# Patient Record
Sex: Male | Born: 1970 | Race: White | Hispanic: No | Marital: Single
Health system: Southern US, Community
[De-identification: ages and names within clinical notes are randomized; demographics above are authoritative.]

## PROBLEM LIST (undated history)

## (undated) DIAGNOSIS — E119 Type 2 diabetes mellitus without complications: Secondary | ICD-10-CM

## (undated) DIAGNOSIS — R197 Diarrhea, unspecified: Secondary | ICD-10-CM

## (undated) DIAGNOSIS — D72829 Elevated white blood cell count, unspecified: Secondary | ICD-10-CM

## (undated) DIAGNOSIS — E785 Hyperlipidemia, unspecified: Secondary | ICD-10-CM

## (undated) DIAGNOSIS — I10 Essential (primary) hypertension: Secondary | ICD-10-CM

## (undated) DIAGNOSIS — R7989 Other specified abnormal findings of blood chemistry: Secondary | ICD-10-CM

## (undated) DIAGNOSIS — F329 Major depressive disorder, single episode, unspecified: Secondary | ICD-10-CM

## (undated) DIAGNOSIS — E876 Hypokalemia: Secondary | ICD-10-CM

## (undated) DIAGNOSIS — F419 Anxiety disorder, unspecified: Secondary | ICD-10-CM

---

## 1994-12-14 HISTORY — PX: FEMUR FRACTURE SURGERY: SHX633

## 1995-12-15 HISTORY — PX: KNEE SURGERY: SHX244

## 2002-12-14 HISTORY — PX: KIDNEY STONE SURGERY: SHX686

## 2012-05-05 ENCOUNTER — Inpatient Hospital Stay (HOSPITAL_COMMUNITY)
Admission: EM | Admit: 2012-05-05 | Discharge: 2012-05-06 | DRG: 312 | Disposition: A | Discharge status: Home / Self Care | Payer: 59 | Attending: Internal Medicine | Admitting: Internal Medicine

## 2012-05-05 ENCOUNTER — Emergency Department (HOSPITAL_COMMUNITY): Payer: 59

## 2012-05-05 ENCOUNTER — Encounter (HOSPITAL_COMMUNITY): Payer: Self-pay | Admitting: *Deleted

## 2012-05-05 ENCOUNTER — Inpatient Hospital Stay (HOSPITAL_COMMUNITY): Payer: 59

## 2012-05-05 DIAGNOSIS — R55 Syncope and collapse: Principal | ICD-10-CM | POA: Diagnosis present

## 2012-05-05 DIAGNOSIS — R7989 Other specified abnormal findings of blood chemistry: Secondary | ICD-10-CM

## 2012-05-05 DIAGNOSIS — F3289 Other specified depressive episodes: Secondary | ICD-10-CM | POA: Diagnosis present

## 2012-05-05 DIAGNOSIS — R739 Hyperglycemia, unspecified: Secondary | ICD-10-CM

## 2012-05-05 DIAGNOSIS — R4182 Altered mental status, unspecified: Secondary | ICD-10-CM

## 2012-05-05 DIAGNOSIS — F329 Major depressive disorder, single episode, unspecified: Secondary | ICD-10-CM | POA: Diagnosis present

## 2012-05-05 DIAGNOSIS — R197 Diarrhea, unspecified: Secondary | ICD-10-CM | POA: Diagnosis present

## 2012-05-05 DIAGNOSIS — I10 Essential (primary) hypertension: Secondary | ICD-10-CM | POA: Diagnosis present

## 2012-05-05 DIAGNOSIS — F32A Depression, unspecified: Secondary | ICD-10-CM | POA: Diagnosis present

## 2012-05-05 DIAGNOSIS — E876 Hypokalemia: Secondary | ICD-10-CM | POA: Diagnosis present

## 2012-05-05 DIAGNOSIS — F411 Generalized anxiety disorder: Secondary | ICD-10-CM | POA: Diagnosis present

## 2012-05-05 DIAGNOSIS — E119 Type 2 diabetes mellitus without complications: Secondary | ICD-10-CM | POA: Diagnosis present

## 2012-05-05 DIAGNOSIS — E871 Hypo-osmolality and hyponatremia: Secondary | ICD-10-CM

## 2012-05-05 DIAGNOSIS — D72829 Elevated white blood cell count, unspecified: Secondary | ICD-10-CM | POA: Diagnosis present

## 2012-05-05 DIAGNOSIS — R509 Fever, unspecified: Secondary | ICD-10-CM

## 2012-05-05 DIAGNOSIS — E785 Hyperlipidemia, unspecified: Secondary | ICD-10-CM | POA: Diagnosis present

## 2012-05-05 DIAGNOSIS — E1165 Type 2 diabetes mellitus with hyperglycemia: Secondary | ICD-10-CM

## 2012-05-05 DIAGNOSIS — F419 Anxiety disorder, unspecified: Secondary | ICD-10-CM | POA: Diagnosis present

## 2012-05-05 HISTORY — DX: Essential (primary) hypertension: I10

## 2012-05-05 HISTORY — DX: Anxiety disorder, unspecified: F41.9

## 2012-05-05 HISTORY — DX: Elevated white blood cell count, unspecified: D72.829

## 2012-05-05 HISTORY — DX: Major depressive disorder, single episode, unspecified: F32.9

## 2012-05-05 HISTORY — DX: Hyperlipidemia, unspecified: E78.5

## 2012-05-05 HISTORY — DX: Hypokalemia: E87.6

## 2012-05-05 HISTORY — DX: Diarrhea, unspecified: R19.7

## 2012-05-05 HISTORY — DX: Depression, unspecified: F32.A

## 2012-05-05 HISTORY — DX: Other specified abnormal findings of blood chemistry: R79.89

## 2012-05-05 LAB — COMPREHENSIVE METABOLIC PANEL
AST: 34 U/L (ref 0–37)
Albumin: 3.9 g/dL (ref 3.5–5.2)
BUN: 5 mg/dL — ABNORMAL LOW (ref 6–23)
Creatinine, Ser: 0.93 mg/dL (ref 0.50–1.35)
Total Protein: 7.3 g/dL (ref 6.0–8.3)

## 2012-05-05 LAB — GLUCOSE, CAPILLARY
Glucose-Capillary: 113 mg/dL — ABNORMAL HIGH (ref 70–99)
Glucose-Capillary: 115 mg/dL — ABNORMAL HIGH (ref 70–99)

## 2012-05-05 LAB — RAPID URINE DRUG SCREEN, HOSP PERFORMED
Amphetamines: NOT DETECTED
Benzodiazepines: POSITIVE — AB
Cocaine: NOT DETECTED
Opiates: NOT DETECTED
Tetrahydrocannabinol: NOT DETECTED

## 2012-05-05 LAB — DIFFERENTIAL
Basophils Absolute: 0 10*3/uL (ref 0.0–0.1)
Basophils Relative: 0 % (ref 0–1)
Eosinophils Absolute: 0.1 10*3/uL (ref 0.0–0.7)
Monocytes Absolute: 1.7 10*3/uL — ABNORMAL HIGH (ref 0.1–1.0)
Monocytes Relative: 8 % (ref 3–12)
Neutrophils Relative %: 79 % — ABNORMAL HIGH (ref 43–77)

## 2012-05-05 LAB — POCT I-STAT TROPONIN I

## 2012-05-05 LAB — URINALYSIS, MICROSCOPIC ONLY
Bilirubin Urine: NEGATIVE
Glucose, UA: >1000 mg/dL — AB
Hgb urine dipstick: NEGATIVE
Ketones, ur: NEGATIVE mg/dL
Protein, ur: NEGATIVE mg/dL
Urine-Other: NONE SEEN

## 2012-05-05 LAB — CK TOTAL AND CKMB (NOT AT ARMC)
CK, MB: 4.8 ng/mL — ABNORMAL HIGH (ref 0.3–4.0)
Total CK: 212 U/L (ref 7–232)

## 2012-05-05 LAB — CARDIAC PANEL(CRET KIN+CKTOT+MB+TROPI)
CK, MB: 4 ng/mL (ref 0.3–4.0)
Total CK: 174 U/L (ref 7–232)

## 2012-05-05 LAB — CBC
HCT: 47.9 % (ref 39.0–52.0)
Hemoglobin: 17 g/dL (ref 13.0–17.0)
MCH: 31.8 pg (ref 26.0–34.0)
MCHC: 35.5 g/dL (ref 30.0–36.0)
RDW: 12.6 % (ref 11.5–15.5)

## 2012-05-05 MED ORDER — INSULIN ASPART 100 UNIT/ML ~~LOC~~ SOLN
0.0000 [IU] | Freq: Three times a day (TID) | SUBCUTANEOUS | Status: DC
  Administered 2012-05-06: 3 [IU] via SUBCUTANEOUS

## 2012-05-05 MED ORDER — ONDANSETRON HCL 4 MG/2ML IJ SOLN: 4.0000 mg | Freq: Four times a day (QID) | INTRAMUSCULAR | Status: DC | PRN

## 2012-05-05 MED ORDER — ACETAMINOPHEN 650 MG RE SUPP
650.0000 mg | Freq: Once | RECTAL | Status: AC
  Administered 2012-05-05: 650 mg via RECTAL
  Filled 2012-05-05: qty 1

## 2012-05-05 MED ORDER — SODIUM CHLORIDE 0.9 % IV SOLN
Freq: Once | INTRAVENOUS | Status: AC
  Administered 2012-05-05: 14:00:00 via INTRAVENOUS

## 2012-05-05 MED ORDER — ONDANSETRON HCL 4 MG PO TABS: 4.0000 mg | ORAL_TABLET | Freq: Four times a day (QID) | ORAL | Status: DC | PRN

## 2012-05-05 MED ORDER — ACETAMINOPHEN 650 MG RE SUPP: 650.0000 mg | Freq: Four times a day (QID) | RECTAL | Status: DC | PRN

## 2012-05-05 MED ORDER — NICOTINE 14 MG/24HR TD PT24
14.0000 mg | MEDICATED_PATCH | Freq: Every day | TRANSDERMAL | Status: DC
  Administered 2012-05-06: 14 mg via TRANSDERMAL
  Filled 2012-05-05 (×2): qty 1

## 2012-05-05 MED ORDER — ALUM & MAG HYDROXIDE-SIMETH 200-200-20 MG/5ML PO SUSP: 30.0000 mL | Freq: Four times a day (QID) | ORAL | Status: DC | PRN

## 2012-05-05 MED ORDER — POTASSIUM CHLORIDE CRYS ER 20 MEQ PO TBCR
40.0000 meq | EXTENDED_RELEASE_TABLET | ORAL | Status: AC
  Administered 2012-05-05 – 2012-05-06 (×2): 40 meq via ORAL
  Filled 2012-05-05 (×2): qty 2

## 2012-05-05 MED ORDER — CITALOPRAM HYDROBROMIDE 40 MG PO TABS
40.0000 mg | ORAL_TABLET | Freq: Every day | ORAL | Status: DC
  Administered 2012-05-05 – 2012-05-06 (×2): 40 mg via ORAL
  Filled 2012-05-05 (×2): qty 1

## 2012-05-05 MED ORDER — HYDROCHLOROTHIAZIDE 25 MG PO TABS
25.0000 mg | ORAL_TABLET | Freq: Every day | ORAL | Status: DC
  Administered 2012-05-05 – 2012-05-06 (×2): 25 mg via ORAL
  Filled 2012-05-05 (×2): qty 1

## 2012-05-05 MED ORDER — POTASSIUM CHLORIDE 10 MEQ/100ML IV SOLN
10.0000 meq | INTRAVENOUS | Status: AC
  Administered 2012-05-05 (×2): 10 meq via INTRAVENOUS
  Filled 2012-05-05 (×2): qty 100

## 2012-05-05 MED ORDER — INSULIN DETEMIR 100 UNIT/ML ~~LOC~~ SOLN
10.0000 [IU] | Freq: Every day | SUBCUTANEOUS | Status: DC
  Administered 2012-05-05: 10 [IU] via SUBCUTANEOUS
  Filled 2012-05-05: qty 10

## 2012-05-05 MED ORDER — TESTOSTERONE CYPIONATE 100 MG/ML IM SOLN: 200.0000 mg | INTRAMUSCULAR | Status: DC

## 2012-05-05 MED ORDER — HALOPERIDOL LACTATE 5 MG/ML IJ SOLN: 5.0000 mg | Freq: Once | INTRAMUSCULAR | Status: DC

## 2012-05-05 MED ORDER — LORAZEPAM 2 MG/ML IJ SOLN
INTRAMUSCULAR | Status: AC
  Administered 2012-05-05: 2 mg via INTRAVENOUS
  Filled 2012-05-05: qty 1

## 2012-05-05 MED ORDER — ACETAMINOPHEN 325 MG PO TABS: 650.0000 mg | ORAL_TABLET | Freq: Four times a day (QID) | ORAL | Status: DC | PRN

## 2012-05-05 MED ORDER — ENOXAPARIN SODIUM 40 MG/0.4ML ~~LOC~~ SOLN
40.0000 mg | SUBCUTANEOUS | Status: DC
  Administered 2012-05-05: 40 mg via SUBCUTANEOUS
  Filled 2012-05-05 (×2): qty 0.4

## 2012-05-05 MED ORDER — POTASSIUM CHLORIDE IN NACL 40-0.9 MEQ/L-% IV SOLN
INTRAVENOUS | Status: DC
  Administered 2012-05-05 – 2012-05-06 (×3): via INTRAVENOUS
  Filled 2012-05-05 (×4): qty 1000

## 2012-05-05 MED ORDER — POTASSIUM CHLORIDE CRYS ER 20 MEQ PO TBCR
40.0000 meq | EXTENDED_RELEASE_TABLET | Freq: Once | ORAL | Status: AC
  Administered 2012-05-05: 40 meq via ORAL
  Filled 2012-05-05: qty 2

## 2012-05-05 MED ORDER — SODIUM CHLORIDE 0.9 % IJ SOLN
3.0000 mL | Freq: Two times a day (BID) | INTRAMUSCULAR | Status: DC
  Administered 2012-05-06: 3 mL via INTRAVENOUS

## 2012-05-05 MED ORDER — OXYCODONE HCL 5 MG PO TABS: 5.0000 mg | ORAL_TABLET | ORAL | Status: DC | PRN

## 2012-05-05 MED ORDER — SODIUM CHLORIDE 0.9 % IV BOLUS (SEPSIS)
1000.0000 mL | Freq: Once | INTRAVENOUS | Status: AC
  Administered 2012-05-05: 1000 mL via INTRAVENOUS

## 2012-05-05 MED ORDER — ALPRAZOLAM 1 MG PO TABS
1.0000 mg | ORAL_TABLET | Freq: Two times a day (BID) | ORAL | Status: DC | PRN
  Administered 2012-05-05 – 2012-05-06 (×2): 1 mg via ORAL
  Filled 2012-05-05: qty 2
  Filled 2012-05-05: qty 1

## 2012-05-05 MED ORDER — LORAZEPAM 2 MG/ML IJ SOLN
2.0000 mg | Freq: Once | INTRAMUSCULAR | Status: AC
  Administered 2012-05-05: 2 mg via INTRAVENOUS

## 2012-05-05 MED ORDER — LINAGLIPTIN 5 MG PO TABS
5.0000 mg | ORAL_TABLET | Freq: Every day | ORAL | Status: DC
  Administered 2012-05-05 – 2012-05-06 (×2): 5 mg via ORAL
  Filled 2012-05-05 (×2): qty 1

## 2012-05-05 MED ORDER — SIMVASTATIN 20 MG PO TABS
20.0000 mg | ORAL_TABLET | Freq: Every day | ORAL | Status: DC
  Filled 2012-05-05: qty 1

## 2012-05-05 NOTE — H&P (Signed)
REDELL NAZIR MRN: [id number] DOB/AGE: 06/09/1971 41 y.o. Primary Care Physician:BRYAN,Marit Goodwill J, PA, PA Admit date: 05/05/2012 Chief Complaint: Syncope HPI:  Wael Maestas is a 41 year old Caucasian gentleman with a history of type 2 diabetes, hypokalemia, chronic leukocytosis, hyperlipidemia, depression/anxiety who presents to the ED with a syncopal episode. History was obtained from the ED physician's notes as well as patient and his fiancee. Patient and family does state that the patient went to work today patient states that has had more output than input. Patient stated that he felt hot at work and drank Gatorade. Patient stated that he suddenly fell like a lead was placed on and subsequently passed out. Patient's fianc stated that she was called by the patient's boss stating that patient had passed out for approximately 2 minutes. Patient subsequently came around after the bloodwork [name] and his name. Once patient awoke patient was confused and in a stupor and patient had a blank stare. Patient was unable to remember his family's names including his fiance and his children. Patient was also noted to be diaphoretic. Patient was subsequently brought per EMS to the ED. In the ED patient was noted to be very agitated was represent outlines and yelling was very incoherent and confused. Patient subsequently became alert and oriented and was answering questions appropriately. Patient denied any headache. No neck stiffness. Patient denied any chest pain, no shortness of breath, no abdominal pain, no dysuria, no cough, no fever, no chills, no loss of bowel or urinary incontinence. Patient does endorse some emesis, some nausea, and generalized weakness. Patient has also endorsed diarrhea which has been on and off for several months after being started on diabetic medications. Patient denies any recent antibiotic use. Lab work was obtained in the ED CT of the head which was done was negative. Chest x-ray which  was done was negative. A CBC was done which showed a white count of 21.7 and a hemoglobin of 17.0. Lactic acid level came back elevated at 3.6. LFTs were within normal limits. EKG done showed a sinus tachycardia. Be met which was done did show a sodium of 131 a potassium of 2.5 and a glucose of 395 otherwise was within normal limits. Will call to admit the patient for further evaluation and management. Patient is currently alert and oriented x3 with coherent speech.  Past Medical History  Diagnosis Date  . Diabetes mellitus   . Hypertension   . Anxiety   . Hypokalemia 05/05/2012  . Leukocytosis 05/05/2012  . Diarrhea 05/05/2012  . HTN (hypertension) 05/05/2012  . Diabetes mellitus 05/05/2012  . Hyperlipidemia 05/05/2012  . Depression 05/05/2012  . Low testosterone 05/05/2012    Past Surgical History  Procedure Date  . Femur fracture surgery 1996    shattered on right side, inserted rod  . Knee surgery 1997    right knee  . Kidney stone surgery 2004    Prior to Admission medications   Medication Sig Start Date End Date Taking? Authorizing Provider  ALPRAZolam Prudy Feeler) 1 MG tablet Take 1 mg by mouth 2 (two) times daily as needed. anxiety   Yes Historical Provider, MD  citalopram (CELEXA) 40 MG tablet Take 40 mg by mouth daily.   Yes Historical Provider, MD  glimepiride (AMARYL) 4 MG tablet Take 2 mg by mouth daily before breakfast. Pt takes 1/2 tab of 4mg  tab for total dose of 2mg    Yes Historical Provider, MD  hydrochlorothiazide (HYDRODIURIL) 25 MG tablet Take 25 mg by mouth daily.   Yes  Historical Provider, MD  insulin aspart (NOVOLOG) 100 UNIT/ML injection Inject into the skin daily. Pt uses sliding scale   Yes Historical Provider, MD  insulin detemir (LEVEMIR) 100 UNIT/ML injection Inject 10 Units into the skin at bedtime.   Yes Historical Provider, MD  potassium chloride SA (K-DUR,KLOR-CON) 20 MEQ tablet Take 20 mEq by mouth daily.   Yes Historical Provider, MD  pravastatin (PRAVACHOL)  40 MG tablet Take 40 mg by mouth daily.   Yes Historical Provider, MD  sitaGLIPtin (JANUVIA) 100 MG tablet Take 100 mg by mouth daily.   Yes Historical Provider, MD  testosterone cypionate (DEPOTESTOTERONE CYPIONATE) 100 MG/ML injection Inject 200 mg into the muscle every 14 (fourteen) days. For IM use only   Yes Historical Provider, MD    Allergies:  Allergies  Allergen Reactions  . Metformin And Related Nausea And Vomiting  . Victoza (Liraglutide) Nausea And Vomiting    History reviewed. No pertinent family history.  Social History:  reports that he has been smoking Cigarettes.  He has a 7.5 pack-year smoking history. His smokeless tobacco use includes Snuff. He reports that he does not drink alcohol or use illicit drugs.  ROS: All systems reviewed with the patient and was positive as per HPI otherwise all other systems are negative.  PHYSICAL EXAM: Blood pressure 133/91, pulse 86, temperature 100 F (37.8 C), resp. rate 21, SpO2 95.00%. General: Well-developed well-nourished in no acute cardiopulmonary distress. Alert, awake, oriented x3, in no acute distress. HEENT: Normocephalic atraumatic. Pupils equal round and reactive light and accommodation. Extraocular movements intact. Oropharynx is clear, no lesions, no exudates. Neck is supple with no lymphadenopathy. No bruits, no goiter. Heart: Regular rate and rhythm, without murmurs, rubs, gallops. Lungs: Clear to auscultation bilaterally. Abdomen: Soft, nontender, nondistended, positive bowel sounds. Extremities: No clubbing cyanosis or edema with positive pedal pulses. Neuro: Alert and oriented x3. Cranial nerves II through XII are grossly intact. Visual fields are intact. 5 out of 5 bilateral upper extremity strength. Follow 5 bilateral lower extremity strength. Sensation is intact. Gait not tested secondary to safety. Grossly intact, nonfocal.    EKG: Sinus tachycardia  No results found for this or any previous visit (from the  past 240 hour(s)).   Lab results:  Basename 05/05/12 1145  NA 131*  K 2.5*  CL 94*  CO2 22  GLUCOSE 395*  BUN 5*  CREATININE 0.93  CALCIUM 8.9  MG 1.9  PHOS --    Basename 05/05/12 1145  AST 34  ALT 36  ALKPHOS 108  BILITOT 0.3  PROT 7.3  ALBUMIN 3.9   No results found for this basename: LIPASE:2,AMYLASE:2 in the last 72 hours  Basename 05/05/12 1145  WBC 21.7*  NEUTROABS 17.1*  HGB 17.0  HCT 47.9  MCV 89.5  PLT 348    Basename 05/05/12 1352  CKTOTAL 212  CKMB 4.8*  CKMBINDEX --  TROPONINI --   No components found with this basename: POCBNP:3 No results found for this basename: DDIMER in the last 72 hours No results found for this basename: HGBA1C:2 in the last 72 hours No results found for this basename: CHOL:2,HDL:2,LDLCALC:2,TRIG:2,CHOLHDL:2,LDLDIRECT:2 in the last 72 hours No results found for this basename: TSH,T4TOTAL,FREET3,T3FREE,THYROIDAB in the last 72 hours No results found for this basename: VITAMINB12:2,FOLATE:2,FERRITIN:2,TIBC:2,IRON:2,RETICCTPCT:2 in the last 72 hours Imaging results:  Ct Head Wo Contrast  05/05/2012  *RADIOLOGY REPORT*  Clinical Data: Altered level of consciousness, confusion, question hemorrhage  CT HEAD WITHOUT CONTRAST  Technique:  Contiguous axial images  were obtained from the base of the skull through the vertex without contrast.  Comparison: None  Findings: Normal ventricular morphology. No midline shift or mass effect. Normal appearance of brain parenchyma. No intracranial hemorrhage, mass lesion, or acute infarction. Visualized paranasal sinuses and mastoid air cells clear. Bones unremarkable.  IMPRESSION: Normal exam.  Original Report Authenticated By: Lollie Marrow, M.D.   Dg Chest Port 1 View  05/05/2012  *RADIOLOGY REPORT*  Clinical Data: Altered mental status.  PORTABLE CHEST - 1 VIEW  Comparison: None.  Findings: Low lung volumes. Heart and mediastinal contours are within normal limits.  No focal opacities or  effusions.  No acute bony abnormality.  IMPRESSION: Low volumes. No active cardiopulmonary disease.  Original Report Authenticated By: Cyndie Chime, M.D.   Impression/Plan:  Principal Problem:  *Syncope and collapse Active Problems:  Hypokalemia  Leukocytosis  Diarrhea  HTN (hypertension)  Diabetes mellitus  Hyperlipidemia  Depression  Anxiety   #1 syncope and collapse Questionable etiology differential includes orthostasis versus dehydration versus electrolyte abnormalities versus seizure activity as patient was postictal did have some confusion and a blank stare. Will check orthostatics. We'll cycle cardiac enzymes every 8 hours x3. Will check carotid Dopplers. Will check a 2-D echo. CT of the head was negative. We'll get MRI of the brain. We'll place on telemetry. Will hydrate with IV fluids and replete electrolytes. Will check an EEG. We'll check a TSH. The ED PA is awaiting a call for neurology for consultation for further evaluation and management.  #2 hypokalemia Likely multifactorial secondary to GI losses as patient has had some diarrhea nausea and emesis as well as secondary to diuretic therapy which HCTZ. Patient's magnesium level is 1.9. Replete potassium and follow. Patient may need a higher dose of his potassium supplementation on discharge.  #3 diarrhea Likely secondary to diabetes medications as patient has noticed some improvement after medications were changed around. Will check a C. difficile PCR. And follow. MCV PCR is negative we'll place on Imodium as needed.  #4 chronic leukocytosis Patient states that he's had a chronic leukocytosis and has been worked up extensively even by an oncologist with no etiology. Urinalysis negative for UTI. Chest x-ray is negative. Patient has no meningeal signs or symptoms. Blood cultures are pending. Will continue to monitor for now. No need for antibiotics at this time.  #5 diabetes mellitus Check a hemoglobin A1c. Continue  patient's home dose Levemir and Januvia. We'll place on a sliding scale insulin.  #6 hyperlipidemia Check a fasting lipid panel. Place on Zocor.  #8 depression/anxiety Continue home regimen of Celexa and Xanax.  #9 tobacco abuse Patient has been counseled on tobacco cessation. We'll place on a nicotine patch.  #10 prophylaxis Lovenox for DVT prophylaxis.   Shemicka Cohrs 05/05/2012 [id number] , 3:12 PM

## 2012-05-05 NOTE — ED Notes (Signed)
Report given to akia, rn on floor 

## 2012-05-05 NOTE — ED Notes (Signed)
Per ems pt was at work and pt was sitting in a chair and then he laid himself down on the floor. After that pt was not alert, other staff could not make contact with pt or get pt to answer for 30 seconds. When pt came to pt stated "he was the golden child" pt did not know where he was or who he was, or the date. Pt gave blank stare. Stroke scale performed was negative by ems. Slightly diaphoretic. Pt was not cooperative and pulling at stickers and tubes. Pt during transport was laughing and clapping his hands.  20 g L hand.   Upon arrival to ED pt was more diaphoretic, would not answer any questions, would not follow commands, gives blank stares.

## 2012-05-05 NOTE — ED Notes (Signed)
[id number]<AV> Expected date:05/05/12<BR> Expected time:10:51 AM<BR> Means of arrival:Ambulance<BR> Comments:<BR> syncope

## 2012-05-05 NOTE — Consult Note (Signed)
Reason for Consult:Syncope Referring Physician: Janee Morn  CC: Syncope  HPI: Mark Knapp is an 41 y.o. male  who presents to the ED after a syncopal episode. History was obtained from the ED physician's notes as well as patient. Patient stated that he suddenly fell like a lead was placed on and subsequently passed out. Patient's fianc stated that she was called by the patient's boss stating that patient had passed out for approximately 2 minutes.  Once patient awoke patient was confused and had a blank stare. Patient was unable to remember his family's names including his fiance and his children. Patient was also noted to be diaphoretic. Patient was subsequently brought per EMS to the ED. In the ED patient was noted to be very agitated/combative and yelling was very incoherent and confused. Patient subsequently became alert and oriented and was answering questions appropriately.   Past Medical History  Diagnosis Date  . Diabetes mellitus   . Hypertension   . Anxiety   . Hypokalemia 05/05/2012  . Leukocytosis 05/05/2012  . Diarrhea 05/05/2012  . HTN (hypertension) 05/05/2012  . Diabetes mellitus 05/05/2012  . Hyperlipidemia 05/05/2012  . Depression 05/05/2012  . Low testosterone 05/05/2012    Past Surgical History  Procedure Date  . Femur fracture surgery 1996    shattered on right side, inserted rod  . Knee surgery 1997    right knee  . Kidney stone surgery 2004    History reviewed. No pertinent family history.  Social History:  reports that he has been smoking Cigarettes.  He has a 7.5 pack-year smoking history. His smokeless tobacco use includes Snuff. He reports that he does not drink alcohol or use illicit drugs.  Allergies  Allergen Reactions  . Metformin And Related Nausea And Vomiting  . Victoza (Liraglutide) Nausea And Vomiting    Medications:  I have reviewed the patient's current medications. Prior to Admission:  Prescriptions prior to admission  Medication Sig  Dispense Refill  . ALPRAZolam (XANAX) 1 MG tablet Take 1 mg by mouth 2 (two) times daily as needed. anxiety      . citalopram (CELEXA) 40 MG tablet Take 40 mg by mouth daily.      Marland Kitchen glimepiride (AMARYL) 4 MG tablet Take 2 mg by mouth daily before breakfast. Pt takes 1/2 tab of 4mg  tab for total dose of 2mg       . hydrochlorothiazide (HYDRODIURIL) 25 MG tablet Take 25 mg by mouth daily.      . insulin aspart (NOVOLOG) 100 UNIT/ML injection Inject into the skin daily. Pt uses sliding scale      . insulin detemir (LEVEMIR) 100 UNIT/ML injection Inject 10 Units into the skin at bedtime.      . potassium chloride SA (K-DUR,KLOR-CON) 20 MEQ tablet Take 20 mEq by mouth daily.      . pravastatin (PRAVACHOL) 40 MG tablet Take 40 mg by mouth daily.      . sitaGLIPtin (JANUVIA) 100 MG tablet Take 100 mg by mouth daily.      Marland Kitchen testosterone cypionate (DEPOTESTOTERONE CYPIONATE) 100 MG/ML injection Inject 200 mg into the muscle every 14 (fourteen) days. For IM use only       Scheduled:   . sodium chloride   Intravenous Once  . acetaminophen  650 mg Rectal Once  . citalopram  40 mg Oral Daily  . enoxaparin  40 mg Subcutaneous Q24H  . haloperidol lactate  5 mg Intravenous Once  . hydrochlorothiazide  25 mg Oral Daily  .  insulin aspart  0-15 Units Subcutaneous TID WC  . insulin detemir  10 Units Subcutaneous QHS  . linagliptin  5 mg Oral Daily  . LORazepam  2 mg Intravenous Once  . nicotine  14 mg Transdermal Daily  . potassium chloride  10 mEq Intravenous Q1 Hr x 2  . potassium chloride  40 mEq Oral Once  . potassium chloride  40 mEq Oral Q4H  . simvastatin  20 mg Oral q1800  . sodium chloride  1,000 mL Intravenous Once  . sodium chloride  3 mL Intravenous Q12H  . testosterone cypionate  200 mg Intramuscular Q14 Days    ROS: History obtained from the patient  General ROS: weakness Psychological ROS: negative for - behavioral disorder, hallucinations, memory difficulties, mood swings or suicidal  ideation Ophthalmic ROS: negative for - blurry vision, double vision, eye pain or loss of vision ENT ROS: negative for - epistaxis, nasal discharge, oral lesions, sore throat, tinnitus or vertigo Allergy and Immunology ROS: negative for - hives or itchy/watery eyes Hematological and Lymphatic ROS: negative for - bleeding problems, bruising or swollen lymph nodes Endocrine ROS: negative for - galactorrhea, hair pattern changes, polydipsia/polyuria or temperature intolerance Respiratory ROS: negative for - cough, hemoptysis, shortness of breath or wheezing Cardiovascular ROS: negative for - chest pain, dyspnea on exertion, edema or irregular heartbeat Gastrointestinal ROS: diarrhea Genito-Urinary ROS: negative for - dysuria, hematuria, incontinence or urinary frequency/urgency Musculoskeletal ROS: negative for - joint swelling or muscular weakness Neurological ROS: as noted in HPI Dermatological ROS: negative for rash and skin lesion changes  Physical Examination: Blood pressure 127/85, pulse 82, temperature 99.1 F (37.3 C), resp. rate 20, height 5\' 8"  (1.727 m), weight 108.863 kg (240 lb), SpO2 98.00%.  Neurologic Examination Mental Status: Alert, oriented, thought content appropriate.  Speech fluent without evidence of aphasia.  Able to follow 3 step commands without difficulty. Cranial Nerves: II: visual fields grossly normal, pupils equal, round, reactive to light and accommodation III,IV, VI: ptosis not present, extra-ocular motions intact bilaterally V,VII: smile symmetric, facial light touch sensation normal bilaterally VIII: hearing normal bilaterally IX,X: gag reflex present XI: trapezius strength/neck flexion strength normal bilaterally XII: tongue strength normal  Motor: Right : Upper extremity   5/5    Left:     Upper extremity   5/5  Lower extremity   5/5     Lower extremity   5/5 Tone and bulk:normal tone throughout; no atrophy noted Sensory: Pinprick and light touch  intact throughout, bilaterally Deep Tendon Reflexes: Symmetric throughout Plantars: Right: downgoing   Left: downgoing Cerebellar: normal finger-to-nose and normal heel-to-shin test   Results for orders placed during the hospital encounter of 05/05/12 (from the past 48 hour(s))  GLUCOSE, CAPILLARY     Status: Abnormal   Collection Time   05/05/12 11:25 AM      Component Value Range Comment   Glucose-Capillary 378 (*) 70 - 99 (mg/dL)   CBC     Status: Abnormal   Collection Time   05/05/12 11:45 AM      Component Value Range Comment   WBC 21.7 (*) 4.0 - 10.5 (K/uL)    RBC 5.35  4.22 - 5.81 (MIL/uL)    Hemoglobin 17.0  13.0 - 17.0 (g/dL)    HCT 54.0  98.1 - 19.1 (%)    MCV 89.5  78.0 - 100.0 (fL)    MCH 31.8  26.0 - 34.0 (pg)    MCHC 35.5  30.0 - 36.0 (g/dL)  RDW 12.6  11.5 - 15.5 (%)    Platelets 348  150 - 400 (K/uL)   DIFFERENTIAL     Status: Abnormal   Collection Time   05/05/12 11:45 AM      Component Value Range Comment   Neutrophils Relative 79 (*) 43 - 77 (%)    Neutro Abs 17.1 (*) 1.7 - 7.7 (K/uL)    Lymphocytes Relative 13  12 - 46 (%)    Lymphs Abs 2.7  0.7 - 4.0 (K/uL)    Monocytes Relative 8  3 - 12 (%)    Monocytes Absolute 1.7 (*) 0.1 - 1.0 (K/uL)    Eosinophils Relative 1  0 - 5 (%)    Eosinophils Absolute 0.1  0.0 - 0.7 (K/uL)    Basophils Relative 0  0 - 1 (%)    Basophils Absolute 0.0  0.0 - 0.1 (K/uL)   COMPREHENSIVE METABOLIC PANEL     Status: Abnormal   Collection Time   05/05/12 11:45 AM      Component Value Range Comment   Sodium 131 (*) 135 - 145 (mEq/L)    Potassium 2.5 (*) 3.5 - 5.1 (mEq/L)    Chloride 94 (*) 96 - 112 (mEq/L)    CO2 22  19 - 32 (mEq/L)    Glucose, Bld 395 (*) 70 - 99 (mg/dL)    BUN 5 (*) 6 - 23 (mg/dL)    Creatinine, Ser 1.47  0.50 - 1.35 (mg/dL)    Calcium 8.9  8.4 - 10.5 (mg/dL)    Total Protein 7.3  6.0 - 8.3 (g/dL)    Albumin 3.9  3.5 - 5.2 (g/dL)    AST 34  0 - 37 (U/L)    ALT 36  0 - 53 (U/L)    Alkaline Phosphatase  108  39 - 117 (U/L)    Total Bilirubin 0.3  0.3 - 1.2 (mg/dL)    GFR calc non Af Amer >90  >90 (mL/min)    GFR calc Af Amer >90  >90 (mL/min)   ETHANOL     Status: Normal   Collection Time   05/05/12 11:45 AM      Component Value Range Comment   Alcohol, Ethyl (B) <11  0 - 11 (mg/dL)   LACTIC ACID, PLASMA     Status: Abnormal   Collection Time   05/05/12 11:45 AM      Component Value Range Comment   Lactic Acid, Venous 3.6 (*) 0.5 - 2.2 (mmol/L)   MAGNESIUM     Status: Normal   Collection Time   05/05/12 11:45 AM      Component Value Range Comment   Magnesium 1.9  1.5 - 2.5 (mg/dL)   URINE RAPID DRUG SCREEN (HOSP PERFORMED)     Status: Abnormal   Collection Time   05/05/12 11:49 AM      Component Value Range Comment   Opiates NONE DETECTED  NONE DETECTED     Cocaine NONE DETECTED  NONE DETECTED     Benzodiazepines POSITIVE (*) NONE DETECTED     Amphetamines NONE DETECTED  NONE DETECTED     Tetrahydrocannabinol NONE DETECTED  NONE DETECTED     Barbiturates NONE DETECTED  NONE DETECTED    URINALYSIS, WITH MICROSCOPIC     Status: Abnormal   Collection Time   05/05/12 11:49 AM      Component Value Range Comment   Color, Urine YELLOW  YELLOW     APPearance CLEAR  CLEAR  Specific Gravity, Urine 1.039 (*) 1.005 - 1.030     pH 5.5  5.0 - 8.0     Glucose, UA >1000 (*) NEGATIVE (mg/dL)    Hgb urine dipstick NEGATIVE  NEGATIVE     Bilirubin Urine NEGATIVE  NEGATIVE     Ketones, ur NEGATIVE  NEGATIVE (mg/dL)    Protein, ur NEGATIVE  NEGATIVE (mg/dL)    Urobilinogen, UA 0.2  0.0 - 1.0 (mg/dL)    Nitrite NEGATIVE  NEGATIVE     Leukocytes, UA NEGATIVE  NEGATIVE     Urine-Other        Value: NO FORMED ELEMENTS SEEN ON URINE MICROSCOPIC EXAMINATION  POCT I-STAT TROPONIN I     Status: Normal   Collection Time   05/05/12 12:05 PM      Component Value Range Comment   Troponin i, poc 0.00  0.00 - 0.08 (ng/mL)    Comment 3            GLUCOSE, CAPILLARY     Status: Abnormal   Collection  Time   05/05/12  1:20 PM      Component Value Range Comment   Glucose-Capillary 273 (*) 70 - 99 (mg/dL)   CK TOTAL AND CKMB     Status: Abnormal   Collection Time   05/05/12  1:52 PM      Component Value Range Comment   Total CK 212  7 - 232 (U/L)    CK, MB 4.8 (*) 0.3 - 4.0 (ng/mL)    Relative Index 2.3  0.0 - 2.5      No results found for this or any previous visit (from the past 240 hour(s)).  Ct Head Wo Contrast  05/05/2012  *RADIOLOGY REPORT*  Clinical Data: Altered level of consciousness, confusion, question hemorrhage  CT HEAD WITHOUT CONTRAST  Technique:  Contiguous axial images were obtained from the base of the skull through the vertex without contrast.  Comparison: None  Findings: Normal ventricular morphology. No midline shift or mass effect. Normal appearance of brain parenchyma. No intracranial hemorrhage, mass lesion, or acute infarction. Visualized paranasal sinuses and mastoid air cells clear. Bones unremarkable.  IMPRESSION: Normal exam.  Original Report Authenticated By: Lollie Marrow, M.D.   Mr Brain Wo Contrast  05/05/2012  *RADIOLOGY REPORT*  Clinical Data: Diabetic hypertensive patient passed out at work today.  Altered mental status.  MRI HEAD WITHOUT CONTRAST  Technique:  Multiplanar, multiecho pulse sequences of the brain and surrounding structures were obtained according to standard protocol without intravenous contrast.  Comparison: 05/05/2012 CT.  No comparison MR.  Findings: The patient would not hold still for the examination. Complete imaging not appear to be obtained. Some of the images obtained are motion degraded. Five sequences were able to be obtained.  No acute infarct.  No intracranial hemorrhage.  No intracranial mass lesion detected on this motion degraded unenhanced exam.  Major intracranial vascular structures are patent.  Vertebral artery causes impression upon the undersurface of the hypothalamic region.  No hydrocephalus.  Exophthalmos.  Minimal  paranasal sinus mucosal thickening.  IMPRESSION: Motion degraded exam.  The patient was not able to complete all sequences.  No acute infarct.  Please see above.  Original Report Authenticated By: Fuller Canada, M.D.   Dg Chest Port 1 View  05/05/2012  *RADIOLOGY REPORT*  Clinical Data: Altered mental status.  PORTABLE CHEST - 1 VIEW  Comparison: None.  Findings: Low lung volumes. Heart and mediastinal contours are within normal limits.  No  focal opacities or effusions.  No acute bony abnormality.  IMPRESSION: Low volumes. No active cardiopulmonary disease.  Original Report Authenticated By: Cyndie Chime, M.D.     Assessment/Plan:  Patient Active Hospital Problem List: Syncope and collapse (05/05/2012)   Assessment: Patient with episode of syncope.  Etiology unclear.  Blood sugar elevated.  MRI with artifact but unremarkable.  May very well have been orthostatic since patient was working in the heat and did not drink or eat today.  Multiple metabolic abnormalities noted.  Had fluids prior to orthostatic evaluation. Will rule out other possible neurologic etiologies.   Plan:  1.  EEG pending  2.  If above unremarkable no further neurologic work up recommended and would not consider AED initiation.     Thana Farr, MD [facility] [phone] 05/05/2012, 6:54 PM

## 2012-05-05 NOTE — ED Notes (Signed)
hopsitalist by bedside

## 2012-05-05 NOTE — ED Provider Notes (Cosign Needed Addendum)
History     CSN: [id number]  Arrival date & time 05/05/12  1110   First MD Initiated Contact with Patient 05/05/12 1112      No chief complaint on file.   (Consider location/radiation/quality/duration/timing/severity/associated sxs/prior treatment) Patient is a 41 y.o. male presenting with altered mental status. The history is provided by the EMS personnel and the spouse.  Altered Mental Status This is a new problem. The current episode started today. The problem occurs constantly. The problem has been unchanged.  Per ems, possible syncopal episode followed by confusion, diaphoreses, metal status change. Per wife, pt had similar episode yesterday, only lasting few minutes. Wife asked him if he knew who he is, pt stated yes, but could not think of wife's name.   History reviewed. No pertinent past medical history.  History reviewed. No pertinent past surgical history.  History reviewed. No pertinent family history.  History  Substance Use Topics  . Smoking status: Not on file  . Smokeless tobacco: Not on file  . Alcohol Use: Not on file      Review of Systems  Unable to perform ROS: Mental status change  Psychiatric/Behavioral: Positive for altered mental status.    Allergies  Review of patient's allergies indicates not on file.  Home Medications   Current Outpatient Rx  Name Route Sig Dispense Refill  . ALPRAZOLAM 0.25 MG PO TABS Oral Take 0.25 mg by mouth at bedtime as needed.    . INSULIN ASPART 100 UNIT/ML [place] SOLN Subcutaneous Inject into the skin 3 (three) times daily before meals.      There were no vitals taken for this visit.  Physical Exam  Nursing note and vitals reviewed. Constitutional: He appears well-developed and well-nourished.       Appears disoriented, aggitated  HENT:  Head: Normocephalic and atraumatic.  Left Ear: External ear normal.  Nose: Nose normal.  Mouth/Throat: Oropharynx is clear and moist.  Eyes: Conjunctivae normal and EOM are  normal. Pupils are equal, round, and reactive to light.  Neck: Neck supple.  Cardiovascular: Normal rate, regular rhythm and normal heart sounds.   Pulmonary/Chest: Effort normal and breath sounds normal. No respiratory distress. He has no wheezes. He has no rales.  Abdominal: Soft. Bowel sounds are normal. He exhibits no distension. There is no tenderness.  Musculoskeletal: He exhibits no edema.  Neurological: He is alert.       Pt is disoriented, unable to answer questions appropriately, he is however talking, agitated, fighting staff. Unable to do good neuro exam due to his agitation.   Skin: Skin is warm and dry.  Psychiatric: His affect is angry and inappropriate. He is agitated, aggressive and combative. Cognition and memory are impaired. He exhibits abnormal recent memory and abnormal remote memory.    ED Course  Procedures (including critical care time) 11:29 AM Pt seen and evaluated PT with acute onset of AMS at work today. Pt unable to give any hx, child like acting, alternated with blank stairs and agitation. Pt diaphoretic, tachycardic, high blood pressure. Labs and CT head ordered. Spoke with CT tech to take him to the scanner next  11:45 AM Pt became very aggitated, started ripping lines out and yelling. Pt needs to go to a CT scan to r/o a bleed. Atvan 2mg  and haldol ordered.   11:57 AM Pt received 2mg  of ativan. He is back to being alert and oriented. He is asking questions appropriately. He is not sure as to what happened. Denies headache. CT labs  still pending  Results for orders placed during the hospital encounter of 05/05/12  CBC      Component Value Range   WBC 21.7 (*) 4.0 - 10.5 (K/uL)   RBC 5.35  4.22 - 5.81 (MIL/uL)   Hemoglobin 17.0  13.0 - 17.0 (g/dL)   HCT 16.1  09.6 - 04.5 (%)   MCV 89.5  78.0 - 100.0 (fL)   MCH 31.8  26.0 - 34.0 (pg)   MCHC 35.5  30.0 - 36.0 (g/dL)   RDW 40.9  81.1 - 91.4 (%)   Platelets 348  150 - 400 (K/uL)  DIFFERENTIAL       Component Value Range   Neutrophils Relative 79 (*) 43 - 77 (%)   Neutro Abs 17.1 (*) 1.7 - 7.7 (K/uL)   Lymphocytes Relative 13  12 - 46 (%)   Lymphs Abs 2.7  0.7 - 4.0 (K/uL)   Monocytes Relative 8  3 - 12 (%)   Monocytes Absolute 1.7 (*) 0.1 - 1.0 (K/uL)   Eosinophils Relative 1  0 - 5 (%)   Eosinophils Absolute 0.1  0.0 - 0.7 (K/uL)   Basophils Relative 0  0 - 1 (%)   Basophils Absolute 0.0  0.0 - 0.1 (K/uL)  COMPREHENSIVE METABOLIC PANEL      Component Value Range   Sodium 131 (*) 135 - 145 (mEq/L)   Potassium 2.5 (*) 3.5 - 5.1 (mEq/L)   Chloride 94 (*) 96 - 112 (mEq/L)   CO2 22  19 - 32 (mEq/L)   Glucose, Bld 395 (*) 70 - 99 (mg/dL)   BUN 5 (*) 6 - 23 (mg/dL)   Creatinine, Ser 7.82  0.50 - 1.35 (mg/dL)   Calcium 8.9  8.4 - 95.6 (mg/dL)   Total Protein 7.3  6.0 - 8.3 (g/dL)   Albumin 3.9  3.5 - 5.2 (g/dL)   AST 34  0 - 37 (U/L)   ALT 36  0 - 53 (U/L)   Alkaline Phosphatase 108  39 - 117 (U/L)   Total Bilirubin 0.3  0.3 - 1.2 (mg/dL)   GFR calc non Af Amer >90  >90 (mL/min)   GFR calc Af Amer >90  >90 (mL/min)  URINE RAPID DRUG SCREEN (HOSP PERFORMED)      Component Value Range   Opiates NONE DETECTED  NONE DETECTED    Cocaine NONE DETECTED  NONE DETECTED    Benzodiazepines POSITIVE (*) NONE DETECTED    Amphetamines NONE DETECTED  NONE DETECTED    Tetrahydrocannabinol NONE DETECTED  NONE DETECTED    Barbiturates NONE DETECTED  NONE DETECTED   ETHANOL      Component Value Range   Alcohol, Ethyl (B) <11  0 - 11 (mg/dL)  URINALYSIS, WITH MICROSCOPIC      Component Value Range   Color, Urine YELLOW  YELLOW    APPearance CLEAR  CLEAR    Specific Gravity, Urine 1.039 (*) 1.005 - 1.030    pH 5.5  5.0 - 8.0    Glucose, UA >1000 (*) NEGATIVE (mg/dL)   Hgb urine dipstick NEGATIVE  NEGATIVE    Bilirubin Urine NEGATIVE  NEGATIVE    Ketones, ur NEGATIVE  NEGATIVE (mg/dL)   Protein, ur NEGATIVE  NEGATIVE (mg/dL)   Urobilinogen, UA 0.2  0.0 - 1.0 (mg/dL)   Nitrite NEGATIVE   NEGATIVE    Leukocytes, UA NEGATIVE  NEGATIVE    Urine-Other       Value: NO FORMED ELEMENTS SEEN ON URINE MICROSCOPIC EXAMINATION  GLUCOSE, CAPILLARY      Component Value Range   Glucose-Capillary 378 (*) 70 - 99 (mg/dL)  LACTIC ACID, PLASMA      Component Value Range   Lactic Acid, Venous 3.6 (*) 0.5 - 2.2 (mmol/L)  POCT I-STAT TROPONIN I      Component Value Range   Troponin i, poc 0.00  0.00 - 0.08 (ng/mL)   Comment 3           CK TOTAL AND CKMB      Component Value Range   Total CK 212  7 - 232 (U/L)   CK, MB 4.8 (*) 0.3 - 4.0 (ng/mL)   Relative Index 2.3  0.0 - 2.5   GLUCOSE, CAPILLARY      Component Value Range   Glucose-Capillary 273 (*) 70 - 99 (mg/dL)  MAGNESIUM      Component Value Range   Magnesium 1.9  1.5 - 2.5 (mg/dL)  CARDIAC PANEL(CRET KIN+CKTOT+MB+TROPI)      Component Value Range   Total CK 174  7 - 232 (U/L)   CK, MB 4.0  0.3 - 4.0 (ng/mL)   Troponin I <0.30  <0.30 (ng/mL)   Relative Index 2.3  0.0 - 2.5   GLUCOSE, CAPILLARY      Component Value Range   Glucose-Capillary 113 (*) 70 - 99 (mg/dL)  GLUCOSE, CAPILLARY      Component Value Range   Glucose-Capillary 115 (*) 70 - 99 (mg/dL)   Comment 1 Documented in Chart     Comment 2 Notify RN     Ct Head Wo Contrast  05/05/2012  *RADIOLOGY REPORT*  Clinical Data: Altered level of consciousness, confusion, question hemorrhage  CT HEAD WITHOUT CONTRAST  Technique:  Contiguous axial images were obtained from the base of the skull through the vertex without contrast.  Comparison: None  Findings: Normal ventricular morphology. No midline shift or mass effect. Normal appearance of brain parenchyma. No intracranial hemorrhage, mass lesion, or acute infarction. Visualized paranasal sinuses and mastoid air cells clear. Bones unremarkable.  IMPRESSION: Normal exam.  Original Report Authenticated By: Lollie Marrow, M.D.    Dg Chest Port 1 View  05/05/2012  *RADIOLOGY REPORT*  Clinical Data: Altered mental status.   PORTABLE CHEST - 1 VIEW  Comparison: None.  Findings: Low lung volumes. Heart and mediastinal contours are within normal limits.  No focal opacities or effusions.  No acute bony abnormality.  IMPRESSION: Low volumes. No active cardiopulmonary disease.  Original Report Authenticated By: Cyndie Chime, M.D.   Pt with elevated lactic acid, elevated WBC, potassium 2.5. Potassium and fluids started. Will admit for further evaluations. DDx includes psychosis vs seizure, vs heat stroke. Spoke wth [facility] will admit. Asked for neuro consult.  Paged neurology multiple times no response.   1. Altered mental status   2. Hypokalemia   3. Fever   4. Hyponatremia   5. Hyperglycemia       MDM          Lottie Mussel, PA 05/05/12 2041  Lottie Mussel, PA 08/24/12 1556

## 2012-05-05 NOTE — ED Notes (Addendum)
Pt was on victosa, a diabetic medicine in march for 1 month. During the month on med pt lost 30 pounds and had n/v/d. After 1 month pt was taken off medication. Pt went back on jenovia and gave pt novolog for emergency.

## 2012-05-05 NOTE — ED Notes (Signed)
At present pt provided with bedside commode and had a bowel movement. Pt calmly resting in bed. Pts temperature fluctuates slightly by a few degrees. Pt goes in and out of diaphoresis. Pt provided with tylenol and ice water to rinse cold rag out in.

## 2012-05-05 NOTE — ED Notes (Addendum)
Pt was combative and screaming that "he was going to die". Did not recognize wife. Pt was given ativan and IV fluids started. Pt with in a few min pt calmed down and started talking coherently. Almost in an instant pt became completely alert and oriented. Pt recognized wife and listed names of his children.

## 2012-05-05 NOTE — ED Notes (Signed)
Verbal order from Janee Morn, MD to change bed request from SD to tele. Bed request updated and bed placement informed.

## 2012-05-05 NOTE — ED Notes (Signed)
First attempt to call report, secretary will call rn back when floor rn has been assigned to room.

## 2012-05-05 NOTE — ED Notes (Signed)
Pt found dipping snuff in room. rn told pt to stop dipping and that pt would get a nicotine patch.

## 2012-05-05 NOTE — ED Notes (Signed)
Pt to MRI

## 2012-05-06 ENCOUNTER — Other Ambulatory Visit (HOSPITAL_COMMUNITY): Payer: 59

## 2012-05-06 DIAGNOSIS — R197 Diarrhea, unspecified: Secondary | ICD-10-CM

## 2012-05-06 DIAGNOSIS — R55 Syncope and collapse: Secondary | ICD-10-CM

## 2012-05-06 DIAGNOSIS — IMO0001 Reserved for inherently not codable concepts without codable children: Secondary | ICD-10-CM

## 2012-05-06 DIAGNOSIS — E876 Hypokalemia: Secondary | ICD-10-CM

## 2012-05-06 DIAGNOSIS — E1165 Type 2 diabetes mellitus with hyperglycemia: Secondary | ICD-10-CM

## 2012-05-06 LAB — BASIC METABOLIC PANEL
CO2: 26 mEq/L (ref 19–32)
Chloride: 105 mEq/L (ref 96–112)
Chloride: 104 mEq/L (ref 96–112)
GFR calc Af Amer: >90 mL/min (ref >90)
GFR calc non Af Amer: >90 mL/min (ref >90)
Glucose, Bld: 104 mg/dL — ABNORMAL HIGH (ref 70–99)
Glucose, Bld: 191 mg/dL — ABNORMAL HIGH (ref 70–99)
Potassium: 3.3 mEq/L — ABNORMAL LOW (ref 3.5–5.1)
Potassium: 3.5 mEq/L (ref 3.5–5.1)
Sodium: 138 mEq/L (ref 135–145)
Sodium: 137 mEq/L (ref 135–145)

## 2012-05-06 LAB — CARDIAC PANEL(CRET KIN+CKTOT+MB+TROPI)
CK, MB: 3.8 ng/mL (ref 0.3–4.0)
Relative Index: 2.5 (ref 0.0–2.5)
Total CK: 153 U/L (ref 7–232)
Total CK: 127 U/L (ref 7–232)

## 2012-05-06 LAB — LIPID PANEL
Cholesterol: 141 mg/dL (ref 0–200)
HDL: 26 mg/dL — ABNORMAL LOW (ref >39)
LDL Cholesterol: 72 mg/dL (ref 0–99)
Triglycerides: 216 mg/dL — ABNORMAL HIGH (ref <150)
VLDL: 43 mg/dL — ABNORMAL HIGH (ref 0–40)

## 2012-05-06 LAB — CBC
HCT: 43.1 % (ref 39.0–52.0)
Hemoglobin: 15.5 g/dL (ref 13.0–17.0)
MCH: 32.3 pg (ref 26.0–34.0)
MCV: 89.8 fL (ref 78.0–100.0)
Platelets: 236 10*3/uL (ref 150–400)
RBC: 4.8 MIL/uL (ref 4.22–5.81)
WBC: 15.4 10*3/uL — ABNORMAL HIGH (ref 4.0–10.5)

## 2012-05-06 LAB — GLUCOSE, CAPILLARY: Glucose-Capillary: 100 mg/dL — ABNORMAL HIGH (ref 70–99)

## 2012-05-06 LAB — HEMOGLOBIN A1C: Mean Plasma Glucose: 194 mg/dL — ABNORMAL HIGH (ref <117)

## 2012-05-06 MED ORDER — POTASSIUM CHLORIDE CRYS ER 20 MEQ PO TBCR: 20.0000 meq | EXTENDED_RELEASE_TABLET | Freq: Every day | ORAL | Status: AC

## 2012-05-06 MED ORDER — POTASSIUM CHLORIDE CRYS ER 20 MEQ PO TBCR
40.0000 meq | EXTENDED_RELEASE_TABLET | ORAL | Status: AC
  Administered 2012-05-06 (×2): 40 meq via ORAL
  Filled 2012-05-06 (×2): qty 2

## 2012-05-06 MED ORDER — GLUCERNA SHAKE PO LIQD
237.0000 mL | Freq: Two times a day (BID) | ORAL | Status: DC
  Filled 2012-05-06: qty 237

## 2012-05-06 MED ORDER — LOPERAMIDE HCL 2 MG PO CAPS: 2.0000 mg | ORAL_CAPSULE | ORAL | Status: DC | PRN

## 2012-05-06 MED ORDER — LISINOPRIL 20 MG PO TABS: 20.0000 mg | ORAL_TABLET | Freq: Every day | ORAL | Status: AC

## 2012-05-06 MED ORDER — LISINOPRIL 20 MG PO TABS
20.0000 mg | ORAL_TABLET | Freq: Every day | ORAL | Status: DC
  Administered 2012-05-06: 20 mg via ORAL
  Filled 2012-05-06: qty 1

## 2012-05-06 NOTE — Progress Notes (Signed)
INITIAL ADULT NUTRITION ASSESSMENT Date: 05/06/2012   Time: 2:01 PM Reason for Assessment: Nutrition risk   ASSESSMENT: Male 41 y.o.  Dx: Syncope and collapse  Food/Nutrition Related Hx: Pt asleep during visit. Fiance present at bedside who reports PTA pt did not eat breakfast, only lunch and dinner. 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 She reports his blood sugars have been good at home except for the past 2 days. She reports pt was on a diabetes medication in March 2013 called Victosa that made him have nausea, vomiting, and diarrhea daily and resulted in a 30 pound unintended weight loss. She reports pt's weight has been stable since then. She reports pt has had on/off diarrhea for the past few months r/t diabetes medications, which she attributes to one of the things that is causing his low potassium, which was 2.5 mEq/L on admission. She states pt has never had diarrhea like this before he was put on the diabetic medications. She states pt has not c/o nausea or vomiting today but did have some loose stools yesterday. Noted pt ate 75% of breakfast.   Hx:  Past Medical History  Diagnosis Date  . Diabetes mellitus   . Hypertension   . Anxiety   . Hypokalemia 05/05/2012  . Leukocytosis 05/05/2012  . Diarrhea 05/05/2012  . HTN (hypertension) 05/05/2012  . Diabetes mellitus 05/05/2012  . Hyperlipidemia 05/05/2012  . Depression 05/05/2012  . Low testosterone 05/05/2012   Related Meds:  Scheduled Meds:   . citalopram  40 mg Oral Daily  . enoxaparin  40 mg Subcutaneous Q24H  . feeding supplement  237 mL Oral BID BM  . haloperidol lactate  5 mg Intravenous Once  . insulin aspart  0-15 Units Subcutaneous TID WC  . insulin detemir  10 Units Subcutaneous QHS  . linagliptin  5 mg Oral Daily  . lisinopril  20 mg Oral Daily  . nicotine  14 mg Transdermal Daily  . potassium chloride  10 mEq Intravenous Q1 Hr x 2  .  potassium chloride  40 mEq Oral Q4H  . potassium chloride  40 mEq Oral Q4H  . simvastatin  20 mg Oral q1800  . sodium chloride  3 mL Intravenous Q12H  . testosterone cypionate  200 mg Intramuscular Q14 Days  . DISCONTD: hydrochlorothiazide  25 mg Oral Daily   Continuous Infusions:   . 0.9 % NaCl with KCl 40 mEq / L 125 mL/hr at 05/06/12 1352   PRN Meds:.acetaminophen, acetaminophen, ALPRAZolam, alum & mag hydroxide-simeth, loperamide, ondansetron (ZOFRAN) IV, ondansetron, oxyCODONE  Ht: 5\' 8"  (172.7 cm)  Wt: 240 lb (108.863 kg)  Ideal Wt: 154 lb % Ideal Wt: 156  Usual Wt: 250 lb % Usual Wt: 96  Body mass index is 36.49 kg/(m^2). Obesity class II  Labs:  CMP     Component Value Date/Time   NA 138 05/06/2012 0152   K 3.3* 05/06/2012 0152   CL 105 05/06/2012 0152   CO2 26 05/06/2012 0152   GLUCOSE 104* 05/06/2012 0152   BUN 6 05/06/2012 0152   CREATININE 0.83 05/06/2012 0152   CALCIUM 8.2* 05/06/2012 0152   PROT 7.3 05/05/2012 1145   ALBUMIN 3.9 05/05/2012 1145   AST 34 05/05/2012 1145   ALT 36 05/05/2012 1145   ALKPHOS 108 05/05/2012 1145   BILITOT 0.3 05/05/2012 1145   GFRNONAA >90 05/06/2012 0152   GFRAA >90 05/06/2012 0152  Lab Results  Component Value Date   HGBA1C 8.4* 05/05/2012   CBG (last 3)   Basename 05/06/12 1201 05/06/12 0751 05/05/12 2024  GLUCAP 165* 100* 115*    Intake/Output Summary (Last 24 hours) at 05/06/12 1413 Last data filed at 05/06/12 0700  Gross per 24 hour  Intake 1285.42 ml  Output    150 ml  Net 1135.42 ml   Last BM - 05/05/12   Diet Order: Carb Control   IVF:    0.9 % NaCl with KCl 40 mEq / L Last Rate: 125 mL/hr at 05/06/12 1352    Estimated Nutritional Needs:   Kcal:1650-1950 Protein:70-85g Fluid:1.6-1.9L  NUTRITION DIAGNOSIS: -Altered GI function (NI-1.4).  Status: Ongoing -Pt meets criteria for severe PCM of acute illness AEB 11% weight loss with <75% energy intake for the past 3 months  RELATED TO: on/off diarrhea for  the past few months  AS EVIDENCE BY: fiance statement with reported 30 pound unintended weight loss in March 2013  MONITORING/EVALUATION(Goals): Pt to consume >75% of meals/supplements.   EDUCATION NEEDS: -Education needs addressed - discussed sources of potassium in the diet with fiance but stated this is not a substitution for pt's potassium medications. Also discussed diabetic diet with recommended portion sizes and meal plans with an emphasis on healthy breakfast choices. Handouts on both topics were provided.   INTERVENTION: Glucerna shakes BID. Anti-diarrheal medications per MD. Will monitor.   Dietitian #: [phone]  DOCUMENTATION CODES Per approved criteria  -Severe malnutrition in the context of acute illness or injury -Obesity Unspecified    Mark Knapp 05/06/2012, 2:01 PM

## 2012-05-06 NOTE — Progress Notes (Signed)
Mr Walkup was admitted at [facility] and under my care from 05/05/12-05/06/12 and may return to work on Wednesday 05/11/12. Thank you for your attention. Please call [phone] with any questions.  Yours Truly   Dr. Ramiro Harvest, MD [facility] [place].

## 2012-05-06 NOTE — Progress Notes (Signed)
VASCULAR LAB PRELIMINARY  PRELIMINARY  PRELIMINARY  PRELIMINARY  Carotid duplex completed.    Preliminary report:  Bilateral:  No evidence of hemodynamically significant internal carotid artery stenosis.   Vertebral artery flow is antegrade.     Mark Knapp, RVS 05/06/2012, 10:44 AM

## 2012-05-06 NOTE — ED Provider Notes (Signed)
Medical screening examination/treatment/procedure(s) were conducted as a shared visit with non-physician practitioner(s) and myself.  I personally evaluated the patient during the encounter  AMS and fever while at work.  Had similar episode yesterday which resolved in minutes.  Possible syncopal episode.  Works in [employer].  Labs, imaging, sx control, admit  Dayton Bailiff, MD 05/06/12 1457

## 2012-05-06 NOTE — Discharge Summary (Signed)
Discharge Summary  Mark Knapp MR#: [id number]  DOB:12/18/1970  Date of Admission: 05/05/2012 Date of Discharge: 05/06/2012  Patient's PCP: Etta Quill, PA, PA  Attending Physician:Irina Okelly  Consults: Treatment Team:  #1: Neurology: Dr. Thana Farr, MD   Discharge Diagnoses: Syncope and collapse Present on Admission:  .Syncope and collapse .Hypokalemia .Leukocytosis .Diarrhea .HTN (hypertension) .Diabetes mellitus .Hyperlipidemia .Depression .Anxiety   Brief Admitting History and Physical Mark Knapp is a 41 year old Caucasian gentleman with a history of type 2 diabetes, hypokalemia, chronic leukocytosis, hyperlipidemia, depression/anxiety who presents to the ED with a syncopal episode. History was obtained from the ED physician's notes as well as patient and his fiancee. Patient and family does state that the patient went to work today patient states that has had more output than input. Patient stated that he felt hot at work and drank Gatorade. Patient stated that he suddenly fell like a lead was placed on and subsequently passed out. Patient's fianc stated that she was called by the patient's boss stating that patient had passed out for approximately 2 minutes. Patient subsequently came around after the bloodwork Mark Knapp and his name. Once patient awoke patient was confused and in a stupor and patient had a blank stare. Patient was unable to remember his family's names including his fiance and his children. Patient was also noted to be diaphoretic. Patient was subsequently brought per EMS to the ED. In the ED patient was noted to be very agitated was represent outlines and yelling was very incoherent and confused. Patient subsequently became alert and oriented and was answering questions appropriately. Patient denied any headache. No neck stiffness. Patient denied any chest pain, no shortness of breath, no abdominal pain, no dysuria, no cough, no fever, no chills, no  loss of bowel or urinary incontinence. Patient does endorse some emesis, some nausea, and generalized weakness. Patient has also endorsed diarrhea which has been on and off for several months after being started on diabetic medications. Patient denies any recent antibiotic use. Lab work was obtained in the ED CT of the head which was done was negative. Chest x-ray which was done was negative. A CBC was done which showed a white count of 21.7 and a hemoglobin of 17.0. Lactic acid level came back elevated at 3.6. LFTs were within normal limits. EKG done showed a sinus tachycardia. Be met which was done did show a sodium of 131 a potassium of 2.5 and a glucose of 395 otherwise was within normal limits. Will call to admit the patient for further evaluation and management. Patient is currently alert and oriented x3 with coherent speech.    Discharge Medications Medication List  As of 05/06/2012  4:17 PM   START taking these medications         lisinopril 20 MG tablet   Commonly known as: PRINIVIL,ZESTRIL   Take 1 tablet (20 mg total) by mouth daily.         CHANGE how you take these medications         potassium chloride SA 20 MEQ tablet   Commonly known as: K-DUR,KLOR-CON   Take 1 tablet (20 mEq total) by mouth daily. TAKE 1 TABLET ON SUNDAY 05/08/12 THEN HOLD UNTIL SEEN BY PCP for labwork early  Next week.   What changed: doctor's instructions         CONTINUE taking these medications         ALPRAZolam 1 MG tablet   Commonly known as: XANAX      citalopram  40 MG tablet   Commonly known as: CELEXA      glimepiride 4 MG tablet   Commonly known as: AMARYL      insulin aspart 100 UNIT/ML injection   Commonly known as: novoLOG      insulin detemir 100 UNIT/ML injection   Commonly known as: LEVEMIR      pravastatin 40 MG tablet   Commonly known as: PRAVACHOL      sitaGLIPtin 100 MG tablet   Commonly known as: JANUVIA      testosterone cypionate 100 MG/ML injection   Commonly known  as: DEPOTESTOTERONE CYPIONATE         STOP taking these medications         hydrochlorothiazide 25 MG tablet          Where to get your medications    These are the prescriptions that you need to pick up.   You may get these medications from any pharmacy.         lisinopril 20 MG tablet         Information on where to get these meds is not yet available. Ask your nurse or doctor.         potassium chloride SA 20 MEQ tablet           Hospital Course: #1 Syncope and collapse Patient was admitted with syncope and collapse. Patient did state on admission that he had decreased oral intake and also was working out in the heat as well. It was noted that patient did have a blank stare and had some agitation and combativeness once he awoke from his syncope. There is a concern for possible seizure activity. Patient was subsequently admitted and underwent a syncopal workup. The patient on admission had received 2 L of IV fluids in the ED prior to orthostatics being drawn. CBC which was done in the field was elevated. Orthostatics were done and were essentially negative. Patient was placed on the telemetry floor and monitored without any arrhythmias noted. Carotid Dopplers were done with preliminary results were negative. A 2-D echo was also done which was unremarkable. Head CT which was done on admission was negative. MRI which was done and was however limited due to patient's motion was also negative. An EEG was ordered however this was unable to be done until Monday, 05/09/2002. Cardiac enzymes which was cycled were negative x3. Patient was placed on IV fluids and his electrolytes were repleted. A neurological consultation was obtained and patient was seen in consultation by Dr. Thana Farr and it was felt that patient's etiology may well have been orthostasis. Patient improved clinically did not have any further syncopal episodes during the hospitalization and the discharged home in stable  and improved condition. Due to the fact that an EEG could not be done during this hospitalization it was recommended that this may be done as outpatient. This was discussed with neurology. Patient will need a referral from his PCP for an outpatient EEG to rule out seizures. Patient was discharged in stable and improved condition.  #2 hypokalemia On admission patient was noted to be hypokalemic with a potassium of 2.5. Patient was noted to be on diuretics and as well he has been having intermittent diarrhea felt secondary to his diabetic medications. A magnesium level was checked and was within normal limits. Patient's potassium was repleted through IV fluids and orally. Patient's HCTZ has been discontinued as it was felt this was part of the etiology of his hyperkalemia. Patient  will be discharged home on lisinopril instead of his HCTZ. Patient has been instructed to take only one dose of his potassium supplements on Sunday, 05/08/2012. Patient will need repeat labs done early next week his PCP to followup on his electrolytes. On day of discharge patient's potassium was 3.5. Patient was given an oral dose of 40 mEq of potassium prior to discharge. Patient be discharged home in stable and improved condition with close followup with his PCP as outpatient.  #3 hypertension Patient was noted to be on HCTZ for blood pressure control. Due to patient's presentation with severe hypokalemia with a potassium of 2.5, and per patient and family has had multiple episodes of hypokalemia it was recommended to discontinue patient's HCTZ. Patient has been placed on lisinopril 20 mg daily for blood pressure control as patient is a diabetic. Patient will need to followup with his PCP you need a followup basic metabolic profile to followup on his electrolytes and renal function. Patient be discharged home in stable and improved condition.  The rest of patient's chronic medical issues remained stable throughout the  hospitalization and patient be discharged in stable and improved condition.   Present on Admission:  .Syncope and collapse .Hypokalemia .Leukocytosis .Diarrhea .HTN (hypertension) .Diabetes mellitus .Hyperlipidemia .Depression .Anxiety   Day of Discharge BP 123/82  Pulse 52  Temp(Src) 97.4 F (36.3 C) (Oral)  Resp 20  Ht 5\' 8"  (1.727 m)  Wt 108.863 kg (240 lb)  BMI 36.49 kg/m2  SpO2 97% See progress note. Results for orders placed during the hospital encounter of 05/05/12 (from the past 48 hour(s))  GLUCOSE, CAPILLARY     Status: Abnormal   Collection Time   05/05/12 11:25 AM      Component Value Range Comment   Glucose-Capillary 378 (*) 70 - 99 (mg/dL)   CBC     Status: Abnormal   Collection Time   05/05/12 11:45 AM      Component Value Range Comment   WBC 21.7 (*) 4.0 - 10.5 (K/uL)    RBC 5.35  4.22 - 5.81 (MIL/uL)    Hemoglobin 17.0  13.0 - 17.0 (g/dL)    HCT 09.8  11.9 - 14.7 (%)    MCV 89.5  78.0 - 100.0 (fL)    MCH 31.8  26.0 - 34.0 (pg)    MCHC 35.5  30.0 - 36.0 (g/dL)    RDW 82.9  56.2 - 13.0 (%)    Platelets 348  150 - 400 (K/uL)   DIFFERENTIAL     Status: Abnormal   Collection Time   05/05/12 11:45 AM      Component Value Range Comment   Neutrophils Relative 79 (*) 43 - 77 (%)    Neutro Abs 17.1 (*) 1.7 - 7.7 (K/uL)    Lymphocytes Relative 13  12 - 46 (%)    Lymphs Abs 2.7  0.7 - 4.0 (K/uL)    Monocytes Relative 8  3 - 12 (%)    Monocytes Absolute 1.7 (*) 0.1 - 1.0 (K/uL)    Eosinophils Relative 1  0 - 5 (%)    Eosinophils Absolute 0.1  0.0 - 0.7 (K/uL)    Basophils Relative 0  0 - 1 (%)    Basophils Absolute 0.0  0.0 - 0.1 (K/uL)   COMPREHENSIVE METABOLIC PANEL     Status: Abnormal   Collection Time   05/05/12 11:45 AM      Component Value Range Comment   Sodium 131 (*) 135 - 145 (mEq/L)  Potassium 2.5 (*) 3.5 - 5.1 (mEq/L)    Chloride 94 (*) 96 - 112 (mEq/L)    CO2 22  19 - 32 (mEq/L)    Glucose, Bld 395 (*) 70 - 99 (mg/dL)    BUN 5 (*) 6 -  23 (mg/dL)    Creatinine, Ser 0.86  0.50 - 1.35 (mg/dL)    Calcium 8.9  8.4 - 10.5 (mg/dL)    Total Protein 7.3  6.0 - 8.3 (g/dL)    Albumin 3.9  3.5 - 5.2 (g/dL)    AST 34  0 - 37 (U/L)    ALT 36  0 - 53 (U/L)    Alkaline Phosphatase 108  39 - 117 (U/L)    Total Bilirubin 0.3  0.3 - 1.2 (mg/dL)    GFR calc non Af Amer >90  >90 (mL/min)    GFR calc Af Amer >90  >90 (mL/min)   ETHANOL     Status: Normal   Collection Time   05/05/12 11:45 AM      Component Value Range Comment   Alcohol, Ethyl (B) <11  0 - 11 (mg/dL)   LACTIC ACID, PLASMA     Status: Abnormal   Collection Time   05/05/12 11:45 AM      Component Value Range Comment   Lactic Acid, Venous 3.6 (*) 0.5 - 2.2 (mmol/L)   CULTURE, BLOOD (ROUTINE X 2)     Status: Normal (Preliminary result)   Collection Time   05/05/12 11:45 AM      Component Value Range Comment   Specimen Description BLOOD LEFT ARM      Special Requests BOTTLES DRAWN AEROBIC AND ANAEROBIC      Culture  Setup Time [id number]      Culture        Value:        BLOOD CULTURE RECEIVED NO GROWTH TO DATE CULTURE WILL BE HELD FOR 5 DAYS BEFORE ISSUING A FINAL NEGATIVE REPORT   Report Status PENDING     MAGNESIUM     Status: Normal   Collection Time   05/05/12 11:45 AM      Component Value Range Comment   Magnesium 1.9  1.5 - 2.5 (mg/dL)   HEMOGLOBIN U1L     Status: Abnormal   Collection Time   05/05/12 11:45 AM      Component Value Range Comment   Hemoglobin A1C 8.4 (*) <5.7 (%)    Mean Plasma Glucose 194 (*) <117 (mg/dL)   TSH     Status: Abnormal   Collection Time   05/05/12 11:45 AM      Component Value Range Comment   TSH 0.343 (*) 0.350 - 4.500 (uIU/mL)   URINE RAPID DRUG SCREEN (HOSP PERFORMED)     Status: Abnormal   Collection Time   05/05/12 11:49 AM      Component Value Range Comment   Opiates NONE DETECTED  NONE DETECTED     Cocaine NONE DETECTED  NONE DETECTED     Benzodiazepines POSITIVE (*) NONE DETECTED     Amphetamines NONE DETECTED   NONE DETECTED     Tetrahydrocannabinol NONE DETECTED  NONE DETECTED     Barbiturates NONE DETECTED  NONE DETECTED    URINALYSIS, WITH MICROSCOPIC     Status: Abnormal   Collection Time   05/05/12 11:49 AM      Component Value Range Comment   Color, Urine YELLOW  YELLOW     APPearance CLEAR  CLEAR  Specific Gravity, Urine 1.039 (*) 1.005 - 1.030     pH 5.5  5.0 - 8.0     Glucose, UA >1000 (*) NEGATIVE (mg/dL)    Hgb urine dipstick NEGATIVE  NEGATIVE     Bilirubin Urine NEGATIVE  NEGATIVE     Ketones, ur NEGATIVE  NEGATIVE (mg/dL)    Protein, ur NEGATIVE  NEGATIVE (mg/dL)    Urobilinogen, UA 0.2  0.0 - 1.0 (mg/dL)    Nitrite NEGATIVE  NEGATIVE     Leukocytes, UA NEGATIVE  NEGATIVE     Urine-Other        Value: NO FORMED ELEMENTS SEEN ON URINE MICROSCOPIC EXAMINATION  CULTURE, BLOOD (ROUTINE X 2)     Status: Normal (Preliminary result)   Collection Time   05/05/12 11:50 AM      Component Value Range Comment   Specimen Description BLOOD RIGHT ARM      Special Requests BOTTLES DRAWN AEROBIC AND ANAEROBIC      Culture  Setup Time [id number]      Culture        Value:        BLOOD CULTURE RECEIVED NO GROWTH TO DATE CULTURE WILL BE HELD FOR 5 DAYS BEFORE ISSUING A FINAL NEGATIVE REPORT   Report Status PENDING     POCT I-STAT TROPONIN I     Status: Normal   Collection Time   05/05/12 12:05 PM      Component Value Range Comment   Troponin i, poc 0.00  0.00 - 0.08 (ng/mL)    Comment 3            GLUCOSE, CAPILLARY     Status: Abnormal   Collection Time   05/05/12  1:20 PM      Component Value Range Comment   Glucose-Capillary 273 (*) 70 - 99 (mg/dL)   CK TOTAL AND CKMB     Status: Abnormal   Collection Time   05/05/12  1:52 PM      Component Value Range Comment   Total CK 212  7 - 232 (U/L)    CK, MB 4.8 (*) 0.3 - 4.0 (ng/mL)    Relative Index 2.3  0.0 - 2.5    CARDIAC PANEL(CRET KIN+CKTOT+MB+TROPI)     Status: Normal   Collection Time   05/05/12  6:20 PM      Component  Value Range Comment   Total CK 174  7 - 232 (U/L)    CK, MB 4.0  0.3 - 4.0 (ng/mL)    Troponin I <0.30  <0.30 (ng/mL)    Relative Index 2.3  0.0 - 2.5    GLUCOSE, CAPILLARY     Status: Abnormal   Collection Time   05/05/12  7:33 PM      Component Value Range Comment   Glucose-Capillary 113 (*) 70 - 99 (mg/dL)   GLUCOSE, CAPILLARY     Status: Abnormal   Collection Time   05/05/12  8:24 PM      Component Value Range Comment   Glucose-Capillary 115 (*) 70 - 99 (mg/dL)    Comment 1 Documented in Chart      Comment 2 Notify RN     CLOSTRIDIUM DIFFICILE BY PCR     Status: Normal   Collection Time   05/05/12  9:00 PM      Component Value Range Comment   C difficile by pcr NEGATIVE  NEGATIVE    LIPID PANEL     Status: Abnormal   Collection  Time   05/06/12  1:52 AM      Component Value Range Comment   Cholesterol 141  0 - 200 (mg/dL)    Triglycerides 161 (*) <150 (mg/dL)    HDL 26 (*) >09 (mg/dL)    Total CHOL/HDL Ratio 5.4      VLDL 43 (*) 0 - 40 (mg/dL)    LDL Cholesterol 72  0 - 99 (mg/dL)   CARDIAC PANEL(CRET KIN+CKTOT+MB+TROPI)     Status: Normal   Collection Time   05/06/12  1:52 AM      Component Value Range Comment   Total CK 153  7 - 232 (U/L)    CK, MB 3.9  0.3 - 4.0 (ng/mL)    Troponin I <0.30  <0.30 (ng/mL)    Relative Index 2.5  0.0 - 2.5    BASIC METABOLIC PANEL     Status: Abnormal   Collection Time   05/06/12  1:52 AM      Component Value Range Comment   Sodium 138  135 - 145 (mEq/L)    Potassium 3.3 (*) 3.5 - 5.1 (mEq/L)    Chloride 105  96 - 112 (mEq/L)    CO2 26  19 - 32 (mEq/L)    Glucose, Bld 104 (*) 70 - 99 (mg/dL)    BUN 6  6 - 23 (mg/dL)    Creatinine, Ser 6.04  0.50 - 1.35 (mg/dL)    Calcium 8.2 (*) 8.4 - 10.5 (mg/dL)    GFR calc non Af Amer >90  >90 (mL/min)    GFR calc Af Amer >90  >90 (mL/min)   CBC     Status: Abnormal   Collection Time   05/06/12  1:52 AM      Component Value Range Comment   WBC 15.4 (*) 4.0 - 10.5 (K/uL)    RBC 4.80  4.22 -  5.81 (MIL/uL)    Hemoglobin 15.5  13.0 - 17.0 (g/dL)    HCT 54.0  98.1 - 19.1 (%)    MCV 89.8  78.0 - 100.0 (fL)    MCH 32.3  26.0 - 34.0 (pg)    MCHC 36.0  30.0 - 36.0 (g/dL)    RDW 47.8  29.5 - 62.1 (%)    Platelets 236  150 - 400 (K/uL)   LACTIC ACID, PLASMA     Status: Normal   Collection Time   05/06/12  1:52 AM      Component Value Range Comment   Lactic Acid, Venous 1.3  0.5 - 2.2 (mmol/L)   MAGNESIUM     Status: Normal   Collection Time   05/06/12  1:52 AM      Component Value Range Comment   Magnesium 2.0  1.5 - 2.5 (mg/dL)   GLUCOSE, CAPILLARY     Status: Abnormal   Collection Time   05/06/12  7:51 AM      Component Value Range Comment   Glucose-Capillary 100 (*) 70 - 99 (mg/dL)   CARDIAC PANEL(CRET KIN+CKTOT+MB+TROPI)     Status: Abnormal   Collection Time   05/06/12 11:05 AM      Component Value Range Comment   Total CK 127  7 - 232 (U/L)    CK, MB 3.8  0.3 - 4.0 (ng/mL)    Troponin I <0.30  <0.30 (ng/mL)    Relative Index 3.0 (*) 0.0 - 2.5    GLUCOSE, CAPILLARY     Status: Abnormal   Collection Time   05/06/12 12:01 PM  Component Value Range Comment   Glucose-Capillary 165 (*) 70 - 99 (mg/dL)   BASIC METABOLIC PANEL     Status: Abnormal   Collection Time   05/06/12  1:56 PM      Component Value Range Comment   Sodium 137  135 - 145 (mEq/L)    Potassium 3.5  3.5 - 5.1 (mEq/L)    Chloride 104  96 - 112 (mEq/L)    CO2 28  19 - 32 (mEq/L)    Glucose, Bld 191 (*) 70 - 99 (mg/dL)    BUN 6  6 - 23 (mg/dL)    Creatinine, Ser 1.61  0.50 - 1.35 (mg/dL)    Calcium 8.1 (*) 8.4 - 10.5 (mg/dL)    GFR calc non Af Amer >90  >90 (mL/min)    GFR calc Af Amer >90  >90 (mL/min)     Ct Head Wo Contrast  05/05/2012  *RADIOLOGY REPORT*  Clinical Data: Altered level of consciousness, confusion, question hemorrhage  CT HEAD WITHOUT CONTRAST  Technique:  Contiguous axial images were obtained from the base of the skull through the vertex without contrast.  Comparison: None   Findings: Normal ventricular morphology. No midline shift or mass effect. Normal appearance of brain parenchyma. No intracranial hemorrhage, mass lesion, or acute infarction. Visualized paranasal sinuses and mastoid air cells clear. Bones unremarkable.  IMPRESSION: Normal exam.  Original Report Authenticated By: Lollie Marrow, M.D.   Mr Brain Wo Contrast  05/05/2012  *RADIOLOGY REPORT*  Clinical Data: Diabetic hypertensive patient passed out at work today.  Altered mental status.  MRI HEAD WITHOUT CONTRAST  Technique:  Multiplanar, multiecho pulse sequences of the brain and surrounding structures were obtained according to standard protocol without intravenous contrast.  Comparison: 05/05/2012 CT.  No comparison MR.  Findings: The patient would not hold still for the examination. Complete imaging not appear to be obtained. Some of the images obtained are motion degraded. Five sequences were able to be obtained.  No acute infarct.  No intracranial hemorrhage.  No intracranial mass lesion detected on this motion degraded unenhanced exam.  Major intracranial vascular structures are patent.  Vertebral artery causes impression upon the undersurface of the hypothalamic region.  No hydrocephalus.  Exophthalmos.  Minimal paranasal sinus mucosal thickening.  IMPRESSION: Motion degraded exam.  The patient was not able to complete all sequences.  No acute infarct.  Please see above.  Original Report Authenticated By: Fuller Canada, M.D.   Dg Chest Port 1 View  05/05/2012  *RADIOLOGY REPORT*  Clinical Data: Altered mental status.  PORTABLE CHEST - 1 VIEW  Comparison: None.  Findings: Low lung volumes. Heart and mediastinal contours are within normal limits.  No focal opacities or effusions.  No acute bony abnormality.  IMPRESSION: Low volumes. No active cardiopulmonary disease.  Original Report Authenticated By: Cyndie Chime, M.D.     Disposition: Home  Diet: Carb modified diet  Activity: Increase activity  slowly   Follow-up Appts: Discharge Orders    Future Appointments: Provider: Department: Dept Phone: Center:   05/09/2012 10:00 AM [facility]  None     Future Orders Please Complete By Expires   Diet Carb Modified      Increase activity slowly      Discharge instructions      Comments:   Follow up with Etta Quill, PA, early next week.       TESTS THAT NEED FOLLOW-UP Patient will need a EEG done as outpatient and will need  his PCP referral for this. Patient will need a BMET done early next week to followup on his electrolytes and renal function. Patient will need his blood pressure reassessed on his new antihypertensive medication of lisinopril 20 mg daily.  Time spent on discharge, talking to the patient, and coordinating care: 50 mins.   SignedRamiro Harvest 05/06/2012, 4:17 PM

## 2012-05-06 NOTE — Progress Notes (Signed)
   CARE MANAGEMENT NOTE 05/06/2012  Patient:  COSTON, MANDATO   Account Number:  [id number]  Date Initiated:  05/06/2012  Documentation initiated by:  Jiles Crocker  Subjective/Objective Assessment:   ADMITTED WITH SYNCOPAL EPISODE     Action/Plan:   Primary Care Physician:BRYAN,DANIEL J, PA; INDEPENDENT PRIOR TO ADMISSION, LIVES WITH SPOUSE   Anticipated DC Date:  05/13/2012   Anticipated DC Plan:  HOME/SELF CARE      DC Planning Services  CM consult            Status of service:  In process, will continue to follow Medicare Important Message given?  NA - LOS <3 / Initial given by admissions (If response is "NO", the following Medicare IM given date fields will be blank)  Per UR Regulation:  Reviewed for med. necessity/level of care/duration of stay  Comments:  05/06/2012- B Constantina Laseter RN, BSN, MHA

## 2012-05-06 NOTE — Progress Notes (Signed)
  Echocardiogram 2D Echocardiogram has been performed.  Cathie Beams Deneen 05/06/2012, 10:02 AM

## 2012-05-06 NOTE — Progress Notes (Signed)
Subjective: Patient with no further syncopal episodes. No complaints. Wants to go home.  Objective: Vital signs in last 24 hours: Filed Vitals:   05/05/12 1805 05/05/12 1806 05/05/12 2026 05/06/12 0635  BP: 130/87 127/85 127/76 123/82  Pulse: 72 82 74 52  Temp:   98.4 F (36.9 C) 97.4 F (36.3 C)  TempSrc:   Oral Oral  Resp: 20 20 20 20   Height:  5\' 8"  (1.727 m)    Weight:  108.863 kg (240 lb)    SpO2:  98% 96% 97%    Intake/Output Summary (Last 24 hours) at 05/06/12 1310 Last data filed at 05/06/12 0700  Gross per 24 hour  Intake 1285.42 ml  Output    150 ml  Net 1135.42 ml    Weight change:   General: Alert, awake, oriented x3, in no acute distress. HEENT: No bruits, no goiter. Heart: Regular rate and rhythm, without murmurs, rubs, gallops. Lungs: Clear to auscultation bilaterally. Abdomen: Soft, nontender, nondistended, positive bowel sounds. Extremities: No clubbing cyanosis or edema with positive pedal pulses. Neuro: Grossly intact, nonfocal.    Lab Results:  Basename 05/06/12 0152 05/05/12 1145  NA 138 131*  K 3.3* 2.5*  CL 105 94*  CO2 26 22  GLUCOSE 104* 395*  BUN 6 5*  CREATININE 0.83 0.93  CALCIUM 8.2* 8.9  MG 2.0 1.9  PHOS -- --    Basename 05/05/12 1145  AST 34  ALT 36  ALKPHOS 108  BILITOT 0.3  PROT 7.3  ALBUMIN 3.9   No results found for this basename: LIPASE:2,AMYLASE:2 in the last 72 hours  Basename 05/06/12 0152 05/05/12 1145  WBC 15.4* 21.7*  NEUTROABS -- 17.1*  HGB 15.5 17.0  HCT 43.1 47.9  MCV 89.8 89.5  PLT 236 348    Basename 05/06/12 1105 05/06/12 0152 05/05/12 1820  CKTOTAL 127 153 174  CKMB 3.8 3.9 4.0  CKMBINDEX -- -- --  TROPONINI <0.30 <0.30 <0.30   No components found with this basename: POCBNP:3 No results found for this basename: DDIMER:2 in the last 72 hours  Basename 05/05/12 1145  HGBA1C 8.4*    Basename 05/06/12 0152  CHOL 141  HDL 26*  LDLCALC 72  TRIG 409*  CHOLHDL 5.4  LDLDIRECT --     Basename 05/05/12 1145  TSH 0.343*  T4TOTAL --  T3FREE --  THYROIDAB --   No results found for this basename: VITAMINB12:2,FOLATE:2,FERRITIN:2,TIBC:2,IRON:2,RETICCTPCT:2 in the last 72 hours  Micro Results: Recent Results (from the past 240 hour(s))  CULTURE, BLOOD (ROUTINE X 2)     Status: Normal (Preliminary result)   Collection Time   05/05/12 11:45 AM      Component Value Range Status Comment   Specimen Description BLOOD LEFT ARM   Final    Special Requests BOTTLES DRAWN AEROBIC AND ANAEROBIC   Final    Culture  Setup Time [id number]   Final    Culture     Final    Value:        BLOOD CULTURE RECEIVED NO GROWTH TO DATE CULTURE WILL BE HELD FOR 5 DAYS BEFORE ISSUING A FINAL NEGATIVE REPORT   Report Status PENDING   Incomplete   CULTURE, BLOOD (ROUTINE X 2)     Status: Normal (Preliminary result)   Collection Time   05/05/12 11:50 AM      Component Value Range Status Comment   Specimen Description BLOOD RIGHT ARM   Final    Special Requests BOTTLES DRAWN AEROBIC AND ANAEROBIC    Final    Culture  Setup Time [id number]   Final    Culture     Final    Value:        BLOOD CULTURE RECEIVED NO GROWTH TO DATE CULTURE WILL BE HELD FOR 5 DAYS BEFORE ISSUING A FINAL NEGATIVE REPORT   Report Status PENDING   Incomplete   CLOSTRIDIUM DIFFICILE BY PCR     Status: Normal   Collection Time   05/05/12  9:00 PM      Component Value Range Status Comment   C difficile by pcr NEGATIVE  NEGATIVE  Final     Studies/Results: Ct Head Wo Contrast  05/05/2012  *RADIOLOGY REPORT*  Clinical Data: Altered level of consciousness, confusion, question hemorrhage  CT HEAD WITHOUT CONTRAST  Technique:  Contiguous axial images were obtained from the base of the skull through the vertex without contrast.  Comparison: None  Findings: Normal ventricular morphology. No midline shift or mass effect. Normal appearance of brain parenchyma. No intracranial hemorrhage, mass lesion, or acute infarction.  Visualized paranasal sinuses and mastoid air cells clear. Bones unremarkable.  IMPRESSION: Normal exam.  Original Report Authenticated By: Lollie Marrow, M.D.   Mr Brain Wo Contrast  05/05/2012  *RADIOLOGY REPORT*  Clinical Data: Diabetic hypertensive patient passed out at work today.  Altered mental status.  MRI HEAD WITHOUT CONTRAST  Technique:  Multiplanar, multiecho pulse sequences of the brain and surrounding structures were obtained according to standard protocol without intravenous contrast.  Comparison: 05/05/2012 CT.  No comparison MR.  Findings: The patient would not hold still for the examination. Complete imaging not appear to be obtained. Some of the images obtained are motion degraded. Five sequences were able to be obtained.  No acute infarct.  No intracranial hemorrhage.  No intracranial mass lesion detected on this motion degraded unenhanced exam.  Major intracranial vascular structures are patent.  Vertebral artery causes impression upon the undersurface of the hypothalamic region.  No hydrocephalus.  Exophthalmos.  Minimal paranasal sinus mucosal thickening.  IMPRESSION: Motion degraded exam.  The patient was not able to complete all sequences.  No acute infarct.  Please see above.  Original Report Authenticated By: Fuller Canada, M.D.   Dg Chest Port 1 View  05/05/2012  *RADIOLOGY REPORT*  Clinical Data: Altered mental status.  PORTABLE CHEST - 1 VIEW  Comparison: None.  Findings: Low lung volumes. Heart and mediastinal contours are within normal limits.  No focal opacities or effusions.  No acute bony abnormality.  IMPRESSION: Low volumes. No active cardiopulmonary disease.  Original Report Authenticated By: Cyndie Chime, M.D.    Medications:     . sodium chloride   Intravenous Once  . citalopram  40 mg Oral Daily  . enoxaparin  40 mg Subcutaneous Q24H  . haloperidol lactate  5 mg Intravenous Once  . insulin aspart  0-15 Units Subcutaneous TID WC  . insulin detemir  10 Units  Subcutaneous QHS  . linagliptin  5 mg Oral Daily  . lisinopril  20 mg Oral Daily  . nicotine  14 mg Transdermal Daily  . potassium chloride  10 mEq Intravenous Q1 Hr x 2  . potassium chloride  40 mEq Oral Once  . potassium chloride  40 mEq Oral Q4H  . potassium chloride  40 mEq Oral Q4H  . simvastatin  20 mg Oral q1800  . sodium chloride  3 mL Intravenous Q12H  . testosterone cypionate  200 mg Intramuscular Q14 Days  .  DISCONTD: hydrochlorothiazide  25 mg Oral Daily    Assessment: Principal Problem:  *Syncope and collapse Active Problems:  Hypokalemia  Leukocytosis  Diarrhea  HTN (hypertension)  Diabetes mellitus  Hyperlipidemia  Depression  Anxiety   Plan: #1 syncope and collapse  Questionable etiology differential includes orthostasis versus dehydration versus electrolyte abnormalities versus seizure activity as patient was postictal did have some confusion and a blank stare. Likely secondary to volume depletion. No further episodes of syncope.Patient had already received 2L in ED prior to orthostatics. Cardiac enzymes neg x3. Prelim carotid Dopplers negative. 2-D echo pending. CT of the head was negative.  MRI of the brain negative. No arrythmias on telemetry. Continue IV fluids and replete electrolytes. EEG pending.  TSH slightly low. Repeat TFT  as outpt. Neuro ff and appreciate input and rxcs. #2 hypokalemia  Likely multifactorial secondary to GI losses as patient has had some diarrhea nausea and emesis as well as secondary to diuretic therapy which HCTZ. Will d/c HCTZ. Patient's magnesium level is 1.9. Replete potassium and follow.  #3 diarrhea  Likely secondary to diabetes medications as patient has noticed some improvement after medications were changed around. C. difficile PCR negative. And follow.  Imodium as needed.  #4 chronic leukocytosis  Patient states that he's had a chronic leukocytosis and has been worked up extensively even by an oncologist with no etiology.  Urinalysis negative for UTI. Chest x-ray is negative. Patient has no meningeal signs or symptoms. Blood cultures are pending. Will continue to monitor for now. WBC trending down. CDiff PCR negative.No need for antibiotics at this time.  #5 diabetes mellitus   hemoglobin A1c = 8.4. Continue patient's home dose Levemir and Januvia and sliding scale insulin.  #6 hyperlipidemia  Continue Zocor.  #7 depression/anxiety  Continue home regimen of Celexa and Xanax.  #8 tobacco abuse  Patient has been counseled on tobacco cessation. We'll place on a nicotine patch.  #9 prophylaxis  Lovenox for DVT prophylaxis.       LOS: 1 day   Serenitie Vinton 05/06/2012, 1:10 PM

## 2012-05-09 ENCOUNTER — Other Ambulatory Visit (HOSPITAL_COMMUNITY): Payer: 59

## 2012-05-11 LAB — CULTURE, BLOOD (ROUTINE X 2)
Culture  Setup Time: 201305231859
Culture: NO GROWTH

## 2013-01-06 ENCOUNTER — Emergency Department: Payer: Self-pay | Admitting: Emergency Medicine

## 2013-01-06 LAB — TROPONIN I: Troponin-I: <0.02 ng/mL

## 2013-01-06 LAB — URINALYSIS, COMPLETE
Bilirubin,UR: NEGATIVE
Ketone: NEGATIVE
Nitrite: NEGATIVE
Specific Gravity: 1.028 (ref 1.003–1.030)

## 2013-01-06 LAB — CBC
MCH: 31.7 pg (ref 26.0–34.0)
MCHC: 34.2 g/dL (ref 32.0–36.0)
RBC: 4.67 10*6/uL (ref 4.40–5.90)

## 2013-01-06 LAB — COMPREHENSIVE METABOLIC PANEL
Alkaline Phosphatase: 129 U/L (ref 50–136)
Calcium, Total: 8.9 mg/dL (ref 8.5–10.1)
Creatinine: 1.13 mg/dL (ref 0.60–1.30)
EGFR (African American): >60
EGFR (Non-African Amer.): >60
Potassium: 4.7 mmol/L (ref 3.5–5.1)
SGOT(AST): 25 U/L (ref 15–37)
Total Protein: 7.3 g/dL (ref 6.4–8.2)

## 2013-01-13 ENCOUNTER — Emergency Department: Payer: Self-pay | Admitting: Emergency Medicine

## 2013-01-13 LAB — COMPREHENSIVE METABOLIC PANEL
Alkaline Phosphatase: 116 U/L (ref 50–136)
BUN: 11 mg/dL (ref 7–18)
Bilirubin,Total: 0.3 mg/dL (ref 0.2–1.0)
Calcium, Total: 8.1 mg/dL — ABNORMAL LOW (ref 8.5–10.1)
Chloride: 109 mmol/L — ABNORMAL HIGH (ref 98–107)
EGFR (African American): >60
EGFR (Non-African Amer.): >60
Glucose: 336 mg/dL — ABNORMAL HIGH (ref 65–99)
SGOT(AST): 28 U/L (ref 15–37)
Total Protein: 7.1 g/dL (ref 6.4–8.2)

## 2013-01-13 LAB — ACETAMINOPHEN LEVEL: Acetaminophen: 33 ug/mL — ABNORMAL HIGH

## 2013-01-13 LAB — CBC
HCT: 42.6 % (ref 40.0–52.0)
MCHC: 34.5 g/dL (ref 32.0–36.0)
RBC: 4.58 10*6/uL (ref 4.40–5.90)

## 2013-01-13 LAB — URINALYSIS, COMPLETE
Glucose,UR: 50 mg/dL (ref 0–75)
Leukocyte Esterase: NEGATIVE
Protein: NEGATIVE
RBC,UR: <1 /HPF (ref 0–5)
Specific Gravity: 1.038 (ref 1.003–1.030)
Squamous Epithelial: <1
WBC UR: <1 /HPF (ref 0–5)

## 2013-01-13 LAB — TROPONIN I: Troponin-I: <0.02 ng/mL

## 2013-01-13 LAB — ETHANOL: Ethanol %: <0.003 % (ref 0.000–0.080)

## 2013-01-13 LAB — DRUG SCREEN, URINE
Barbiturates, Ur Screen: NEGATIVE (ref ?–200)
MDMA (Ecstasy)Ur Screen: NEGATIVE (ref ?–500)
Methadone, Ur Screen: NEGATIVE (ref ?–300)
Opiate, Ur Screen: POSITIVE (ref ?–300)
Phencyclidine (PCP) Ur S: POSITIVE (ref ?–25)

## 2013-01-13 LAB — TSH: Thyroid Stimulating Horm: 1.01 u[IU]/mL

## 2017-05-20 ENCOUNTER — Emergency Department (HOSPITAL_COMMUNITY)
Admission: EM | Admit: 2017-05-20 | Discharge: 2017-05-20 | Disposition: A | Discharge status: Home / Self Care | Payer: Worker's Compensation | Attending: Emergency Medicine | Admitting: Emergency Medicine

## 2017-05-20 ENCOUNTER — Emergency Department (HOSPITAL_COMMUNITY): Payer: Worker's Compensation

## 2017-05-20 ENCOUNTER — Encounter (HOSPITAL_COMMUNITY): Payer: Self-pay | Admitting: *Deleted

## 2017-05-20 DIAGNOSIS — Z79899 Other long term (current) drug therapy: Secondary | ICD-10-CM | POA: Insufficient documentation

## 2017-05-20 DIAGNOSIS — E119 Type 2 diabetes mellitus without complications: Secondary | ICD-10-CM | POA: Insufficient documentation

## 2017-05-20 DIAGNOSIS — S42012A Anterior displaced fracture of sternal end of left clavicle, initial encounter for closed fracture: Secondary | ICD-10-CM | POA: Diagnosis not present

## 2017-05-20 DIAGNOSIS — S80812A Abrasion, left lower leg, initial encounter: Secondary | ICD-10-CM | POA: Insufficient documentation

## 2017-05-20 DIAGNOSIS — S4992XA Unspecified injury of left shoulder and upper arm, initial encounter: Secondary | ICD-10-CM | POA: Diagnosis present

## 2017-05-20 DIAGNOSIS — Y999 Unspecified external cause status: Secondary | ICD-10-CM | POA: Diagnosis not present

## 2017-05-20 DIAGNOSIS — Y92410 Unspecified street and highway as the place of occurrence of the external cause: Secondary | ICD-10-CM | POA: Insufficient documentation

## 2017-05-20 DIAGNOSIS — I10 Essential (primary) hypertension: Secondary | ICD-10-CM | POA: Diagnosis not present

## 2017-05-20 DIAGNOSIS — R935 Abnormal findings on diagnostic imaging of other abdominal regions, including retroperitoneum: Secondary | ICD-10-CM | POA: Diagnosis not present

## 2017-05-20 DIAGNOSIS — Y9389 Activity, other specified: Secondary | ICD-10-CM | POA: Insufficient documentation

## 2017-05-20 DIAGNOSIS — S00412A Abrasion of left ear, initial encounter: Secondary | ICD-10-CM | POA: Diagnosis not present

## 2017-05-20 DIAGNOSIS — S80811A Abrasion, right lower leg, initial encounter: Secondary | ICD-10-CM | POA: Insufficient documentation

## 2017-05-20 DIAGNOSIS — R410 Disorientation, unspecified: Secondary | ICD-10-CM | POA: Insufficient documentation

## 2017-05-20 DIAGNOSIS — R079 Chest pain, unspecified: Secondary | ICD-10-CM | POA: Insufficient documentation

## 2017-05-20 HISTORY — DX: Type 2 diabetes mellitus without complications: E11.9

## 2017-05-20 LAB — URINALYSIS, ROUTINE W REFLEX MICROSCOPIC
Bacteria, UA: NONE SEEN
Bilirubin Urine: NEGATIVE
HGB URINE DIPSTICK: NEGATIVE
KETONES UR: NEGATIVE mg/dL
LEUKOCYTES UA: NEGATIVE
Nitrite: NEGATIVE
PROTEIN: NEGATIVE mg/dL
Specific Gravity, Urine: >1.046 — ABNORMAL HIGH (ref 1.005–1.030)
Squamous Epithelial / HPF: NONE SEEN
pH: 5 (ref 5.0–8.0)

## 2017-05-20 LAB — I-STAT CG4 LACTIC ACID, ED: Lactic Acid, Venous: 4.14 mmol/L (ref 0.5–1.9)

## 2017-05-20 LAB — COMPREHENSIVE METABOLIC PANEL
ALK PHOS: 103 U/L (ref 38–126)
ALT: 20 U/L (ref 17–63)
ANION GAP: 10 (ref 5–15)
AST: 30 U/L (ref 15–41)
Albumin: 3.6 g/dL (ref 3.5–5.0)
BUN: 7 mg/dL (ref 6–20)
CALCIUM: 8.8 mg/dL — AB (ref 8.9–10.3)
CO2: 20 mmol/L — AB (ref 22–32)
Chloride: 101 mmol/L (ref 101–111)
Creatinine, Ser: 0.84 mg/dL (ref 0.61–1.24)
GFR calc non Af Amer: >60 mL/min (ref >60)
Glucose, Bld: 534 mg/dL (ref 65–99)
Potassium: 3.9 mmol/L (ref 3.5–5.1)
SODIUM: 131 mmol/L — AB (ref 135–145)
Total Bilirubin: 0.8 mg/dL (ref 0.3–1.2)
Total Protein: 6.2 g/dL — ABNORMAL LOW (ref 6.5–8.1)

## 2017-05-20 LAB — CBC
HCT: 41.3 % (ref 39.0–52.0)
HEMOGLOBIN: 14.8 g/dL (ref 13.0–17.0)
MCH: 31.3 pg (ref 26.0–34.0)
MCHC: 35.8 g/dL (ref 30.0–36.0)
MCV: 87.3 fL (ref 78.0–100.0)
Platelets: 291 10*3/uL (ref 150–400)
RBC: 4.73 MIL/uL (ref 4.22–5.81)
RDW: 12.8 % (ref 11.5–15.5)
WBC: 14.8 10*3/uL — ABNORMAL HIGH (ref 4.0–10.5)

## 2017-05-20 LAB — PROTIME-INR
INR: 0.97
Prothrombin Time: 12.9 seconds (ref 11.4–15.2)

## 2017-05-20 LAB — CDS SEROLOGY

## 2017-05-20 LAB — I-STAT TROPONIN, ED: TROPONIN I, POC: 0 ng/mL (ref 0.00–0.08)

## 2017-05-20 LAB — SAMPLE TO BLOOD BANK

## 2017-05-20 LAB — ETHANOL

## 2017-05-20 MED ORDER — SODIUM CHLORIDE 0.9 % IV BOLUS (SEPSIS)
1000.0000 mL | Freq: Once | INTRAVENOUS | Status: AC
  Administered 2017-05-20: 1000 mL via INTRAVENOUS

## 2017-05-20 MED ORDER — TETANUS-DIPHTH-ACELL PERTUSSIS 5-2.5-18.5 LF-MCG/0.5 IM SUSP
0.5000 mL | Freq: Once | INTRAMUSCULAR | Status: AC
  Administered 2017-05-20: 0.5 mL via INTRAMUSCULAR
  Filled 2017-05-20: qty 0.5

## 2017-05-20 MED ORDER — HYDROCODONE-ACETAMINOPHEN 5-325 MG PO TABS: 1.0000 | ORAL_TABLET | ORAL | 0 refills | Status: AC | PRN

## 2017-05-20 MED ORDER — IOPAMIDOL (ISOVUE-300) INJECTION 61%
INTRAVENOUS | Status: AC
  Administered 2017-05-20: 100 mL
  Filled 2017-05-20: qty 100

## 2017-05-20 NOTE — ED Notes (Signed)
Patient transported to CT 

## 2017-05-20 NOTE — ED Notes (Signed)
C-collar removed per v/o Tegeler MD

## 2017-05-20 NOTE — Discharge Instructions (Signed)
Please schedule a follow-up appointment with your primary care physician for further management of your clavicle fracture. Please stay hydrated. Please take the pain medicine to help her discomfort is worsened. If any symptoms change or worsen, please return to the nearest emergency department.
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
## 2017-05-20 NOTE — ED Notes (Signed)
Pt stable, understands discharge instructions, and reasons for return.   

## 2017-05-20 NOTE — ED Notes (Signed)
Called phlebotomy for urine drug screen. Second call

## 2017-05-20 NOTE — ED Notes (Signed)
Pt ambulated to restroom with no complaints. Pt ambulated with a steady gait.

## 2017-05-21 ENCOUNTER — Encounter (HOSPITAL_COMMUNITY): Payer: Self-pay | Admitting: *Deleted
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
